# Patient Record
Sex: Female | Born: 1975 | Race: White | Hispanic: Yes | Marital: Married | Smoking: Never smoker
Health system: Southern US, Community
[De-identification: ages and names within clinical notes are randomized; demographics above are authoritative.]

## PROBLEM LIST (undated history)

## (undated) DIAGNOSIS — E119 Type 2 diabetes mellitus without complications: Secondary | ICD-10-CM

## (undated) HISTORY — PX: OTHER SURGICAL HISTORY: SHX169

## (undated) HISTORY — PX: TUBAL LIGATION: SHX77

---

## 2006-12-30 ENCOUNTER — Ambulatory Visit: Payer: Self-pay | Admitting: Internal Medicine

## 2007-01-01 ENCOUNTER — Ambulatory Visit: Payer: Self-pay | Admitting: *Deleted

## 2007-09-13 ENCOUNTER — Inpatient Hospital Stay (HOSPITAL_COMMUNITY): Admission: AC | Admit: 2007-09-13 | Discharge: 2007-09-18 | Payer: Self-pay

## 2007-09-15 ENCOUNTER — Ambulatory Visit: Payer: Self-pay | Admitting: Physical Medicine & Rehabilitation

## 2007-10-06 ENCOUNTER — Inpatient Hospital Stay (HOSPITAL_COMMUNITY): Admission: RE | Admit: 2007-10-06 | Discharge: 2007-10-08 | Payer: Self-pay | Admitting: Orthopaedic Surgery

## 2007-12-31 ENCOUNTER — Encounter: Admission: RE | Admit: 2007-12-31 | Discharge: 2008-03-23 | Payer: Self-pay | Admitting: Orthopaedic Surgery

## 2008-03-30 ENCOUNTER — Encounter: Admission: RE | Admit: 2008-03-30 | Discharge: 2008-04-30 | Payer: Self-pay | Admitting: Orthopaedic Surgery

## 2008-08-16 ENCOUNTER — Emergency Department (HOSPITAL_COMMUNITY): Admission: EM | Admit: 2008-08-16 | Discharge: 2008-08-16 | Payer: Self-pay | Admitting: Emergency Medicine

## 2008-10-13 ENCOUNTER — Ambulatory Visit: Payer: Self-pay | Admitting: Internal Medicine

## 2008-10-19 ENCOUNTER — Ambulatory Visit: Payer: Self-pay | Admitting: Internal Medicine

## 2008-12-16 ENCOUNTER — Encounter (INDEPENDENT_AMBULATORY_CARE_PROVIDER_SITE_OTHER): Payer: Self-pay | Admitting: Adult Health

## 2008-12-16 ENCOUNTER — Ambulatory Visit: Payer: Self-pay | Admitting: Internal Medicine

## 2008-12-16 LAB — CONVERTED CEMR LAB
Albumin: 5 g/dL (ref 3.5–5.2)
BUN: 11 mg/dL (ref 6–23)
CO2: 21 meq/L (ref 19–32)
Calcium: 9.3 mg/dL (ref 8.4–10.5)
Chloride: 106 meq/L (ref 96–112)
Eosinophils Relative: 14 % — ABNORMAL HIGH (ref 0–5)
Glucose, Bld: 93 mg/dL (ref 70–99)
HCT: 40.4 % (ref 36.0–46.0)
HDL: 54 mg/dL (ref >39)
Hemoglobin: 12.9 g/dL (ref 12.0–15.0)
Lymphocytes Relative: 23 % (ref 12–46)
Lymphs Abs: 1.8 10*3/uL (ref 0.7–4.0)
Monocytes Absolute: 0.5 10*3/uL (ref 0.1–1.0)
Monocytes Relative: 6 % (ref 3–12)
Potassium: 4.9 meq/L (ref 3.5–5.3)
RBC: 4.35 M/uL (ref 3.87–5.11)
RDW: 14.1 % (ref 11.5–15.5)
Sodium: 139 meq/L (ref 135–145)
Total Protein: 7.7 g/dL (ref 6.0–8.3)
Triglycerides: 88 mg/dL (ref <150)
WBC: 7.8 10*3/uL (ref 4.0–10.5)

## 2009-01-28 ENCOUNTER — Emergency Department (HOSPITAL_COMMUNITY): Admission: EM | Admit: 2009-01-28 | Discharge: 2009-01-28 | Payer: Self-pay | Admitting: Emergency Medicine

## 2009-02-11 ENCOUNTER — Ambulatory Visit: Payer: Self-pay | Admitting: Internal Medicine

## 2009-02-11 ENCOUNTER — Encounter: Payer: Self-pay | Admitting: Family Medicine

## 2009-02-11 LAB — CONVERTED CEMR LAB
Chlamydia, DNA Probe: NEGATIVE
GC Probe Amp, Genital: NEGATIVE

## 2009-08-02 ENCOUNTER — Ambulatory Visit: Payer: Self-pay | Admitting: Internal Medicine

## 2009-08-15 ENCOUNTER — Emergency Department (HOSPITAL_COMMUNITY): Admission: EM | Admit: 2009-08-15 | Discharge: 2009-08-15 | Payer: Self-pay | Admitting: Emergency Medicine

## 2009-09-08 ENCOUNTER — Ambulatory Visit: Payer: Self-pay | Admitting: Internal Medicine

## 2010-06-12 LAB — URINALYSIS, ROUTINE W REFLEX MICROSCOPIC
Bilirubin Urine: NEGATIVE
Ketones, ur: NEGATIVE mg/dL
Nitrite: NEGATIVE
Specific Gravity, Urine: 1.014 (ref 1.005–1.030)
Urobilinogen, UA: 1 mg/dL (ref 0.0–1.0)
pH: 6.5 (ref 5.0–8.0)

## 2010-07-04 LAB — URINALYSIS, ROUTINE W REFLEX MICROSCOPIC
Bilirubin Urine: NEGATIVE
Glucose, UA: NEGATIVE mg/dL
Ketones, ur: NEGATIVE mg/dL
Protein, ur: NEGATIVE mg/dL
pH: 7 (ref 5.0–8.0)
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
## 2010-12-21 LAB — CBC
HCT: 21.1 — ABNORMAL LOW
HCT: 34.2 — ABNORMAL LOW
Hemoglobin: 7.4 — CL
MCHC: 34.5
MCV: 89.5
MCV: 90.9
MCV: 93.3
MCV: 92.3
Platelets: 150
Platelets: 154
Platelets: 165
Platelets: 176
Platelets: 416 — ABNORMAL HIGH
RBC: 2.73 — ABNORMAL LOW
RBC: 3.78 — ABNORMAL LOW
RDW: 13.3
RDW: 14.7
RDW: 14.8
RDW: 15.2
WBC: 15 — ABNORMAL HIGH
WBC: 8.2
WBC: 9.3
WBC: 9.8
WBC: 9.2

## 2010-12-21 LAB — URINE CULTURE

## 2010-12-21 LAB — POCT I-STAT, CHEM 8
BUN: 9
Creatinine, Ser: 0.8
Glucose, Bld: 128 — ABNORMAL HIGH
Hemoglobin: 12.2
TCO2: 22

## 2010-12-21 LAB — HEMOGLOBIN AND HEMATOCRIT, BLOOD: Hemoglobin: 10.3 — ABNORMAL LOW

## 2010-12-21 LAB — BASIC METABOLIC PANEL
BUN: 1 — ABNORMAL LOW
BUN: 2 — ABNORMAL LOW
BUN: 6
CO2: 26
Calcium: 7.8 — ABNORMAL LOW
Calcium: 8.3 — ABNORMAL LOW
Chloride: 108
Creatinine, Ser: 0.42
Creatinine, Ser: 0.5
Creatinine, Ser: 0.55
GFR calc Af Amer: >60
GFR calc non Af Amer: >60
Glucose, Bld: 120 — ABNORMAL HIGH
Glucose, Bld: 124 — ABNORMAL HIGH
Sodium: 132 — ABNORMAL LOW

## 2010-12-21 LAB — COMPREHENSIVE METABOLIC PANEL
AST: 19
Albumin: 4.1
BUN: 7
Chloride: 102
Creatinine, Ser: 0.51
GFR calc Af Amer: >60
Potassium: 4.2
Total Protein: 7.3

## 2010-12-21 LAB — PROTIME-INR
INR: 1
INR: 1.1
INR: 1.2
INR: 1.4
INR: 1.1
INR: 1.5
Prothrombin Time: 14.4
Prothrombin Time: 15.3 — ABNORMAL HIGH
Prothrombin Time: 16.7 — ABNORMAL HIGH
Prothrombin Time: 17.1 — ABNORMAL HIGH

## 2010-12-21 LAB — URINALYSIS, ROUTINE W REFLEX MICROSCOPIC
Bilirubin Urine: NEGATIVE
Nitrite: NEGATIVE
Specific Gravity, Urine: 1.009
Urobilinogen, UA: 0.2

## 2010-12-21 LAB — TYPE AND SCREEN: Antibody Screen: NEGATIVE

## 2010-12-21 LAB — APTT: aPTT: 40 — ABNORMAL HIGH

## 2010-12-21 LAB — CROSSMATCH: ABO/RH(D): O POS

## 2010-12-21 LAB — ETHANOL: Alcohol, Ethyl (B): <5

## 2010-12-21 LAB — SAMPLE TO BLOOD BANK

## 2010-12-21 LAB — ABO/RH: ABO/RH(D): O POS

## 2010-12-21 LAB — URINE MICROSCOPIC-ADD ON

## 2011-05-05 ENCOUNTER — Emergency Department (HOSPITAL_COMMUNITY): Payer: Self-pay

## 2011-05-05 ENCOUNTER — Encounter (HOSPITAL_COMMUNITY): Payer: Self-pay | Admitting: *Deleted

## 2011-05-05 ENCOUNTER — Emergency Department (HOSPITAL_COMMUNITY)
Admission: EM | Admit: 2011-05-05 | Discharge: 2011-05-05 | Disposition: A | Discharge status: Home / Self Care | Payer: Self-pay | Attending: Emergency Medicine | Admitting: Emergency Medicine

## 2011-05-05 DIAGNOSIS — M79609 Pain in unspecified limb: Secondary | ICD-10-CM | POA: Insufficient documentation

## 2011-05-05 DIAGNOSIS — M545 Low back pain, unspecified: Secondary | ICD-10-CM | POA: Insufficient documentation

## 2011-05-05 DIAGNOSIS — IMO0001 Reserved for inherently not codable concepts without codable children: Secondary | ICD-10-CM | POA: Insufficient documentation

## 2011-05-05 DIAGNOSIS — M533 Sacrococcygeal disorders, not elsewhere classified: Secondary | ICD-10-CM | POA: Insufficient documentation

## 2011-05-05 MED ORDER — HYDROMORPHONE HCL PF 1 MG/ML IJ SOLN
1.0000 mg | Freq: Once | INTRAMUSCULAR | Status: AC
  Administered 2011-05-05: 1 mg via INTRAMUSCULAR
  Filled 2011-05-05: qty 1

## 2011-05-05 MED ORDER — DIAZEPAM 5 MG PO TABS
5.0000 mg | ORAL_TABLET | Freq: Once | ORAL | Status: AC
  Administered 2011-05-05: 5 mg via ORAL
  Filled 2011-05-05: qty 1

## 2011-05-05 MED ORDER — HYDROCODONE-ACETAMINOPHEN 5-325 MG PO TABS: ORAL_TABLET | ORAL | Status: AC

## 2011-05-05 MED ORDER — METHOCARBAMOL 500 MG PO TABS: 1000.0000 mg | ORAL_TABLET | Freq: Four times a day (QID) | ORAL | Status: AC | PRN
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
## 2011-05-05 NOTE — ED Notes (Signed)
Pt given interpreter phone to communicate with MD and RN

## 2011-05-05 NOTE — ED Notes (Signed)
[id number]<AV> Expected date:<BR> Expected time: 4:16 PM<BR> Means of arrival:<BR> Comments:<BR> M50 - 36yoF has pain from head to legs.  No injury
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
## 2011-05-05 NOTE — ED Notes (Signed)
Per EMS- pt is from home complaining of left sided back pain with radiation into her buttock and left leg.  Pt denies history of this prior. Pt ambulatory on scene. Pt complains of increased pain with movement. Pt has used Bengay and a pain medication of unknown name without relief today.

## 2013-03-23 ENCOUNTER — Emergency Department (INDEPENDENT_AMBULATORY_CARE_PROVIDER_SITE_OTHER): Payer: Self-pay

## 2013-03-23 ENCOUNTER — Encounter (HOSPITAL_COMMUNITY): Payer: Self-pay | Admitting: Emergency Medicine

## 2013-03-23 ENCOUNTER — Emergency Department (INDEPENDENT_AMBULATORY_CARE_PROVIDER_SITE_OTHER)
Admission: EM | Admit: 2013-03-23 | Discharge: 2013-03-23 | Disposition: A | Discharge status: Home / Self Care | Payer: Self-pay | Source: Home / Self Care | Attending: Emergency Medicine | Admitting: Emergency Medicine

## 2013-03-23 DIAGNOSIS — J209 Acute bronchitis, unspecified: Secondary | ICD-10-CM

## 2013-03-23 DIAGNOSIS — J111 Influenza due to unidentified influenza virus with other respiratory manifestations: Secondary | ICD-10-CM

## 2013-03-23 MED ORDER — AMOXICILLIN 500 MG PO CAPS: 1000.0000 mg | ORAL_CAPSULE | Freq: Three times a day (TID) | ORAL | Status: DC

## 2013-03-23 MED ORDER — TRAMADOL HCL 50 MG PO TABS: 100.0000 mg | ORAL_TABLET | Freq: Three times a day (TID) | ORAL | Status: DC | PRN

## 2013-03-23 MED ORDER — GUAIFENESIN-CODEINE 100-10 MG/5ML PO SYRP: 10.0000 mL | ORAL_SOLUTION | Freq: Four times a day (QID) | ORAL | Status: DC | PRN

## 2013-03-23 NOTE — ED Notes (Signed)
C/o productive cough that comes and goes.  Sob/chest tightness.  Bilateral ear pain.  Burning sensation in nose.  And  Sore throat.  No relief with otc meds.    On set 3 weeks ago.  Pt is sitting up right no signs of distress.

## 2013-03-23 NOTE — ED Provider Notes (Signed)
Chief Complaint   Chief Complaint  Patient presents with  . URI  . Dizziness  . Cough   History of Present Illness   Lynn Vazquez is a 37 year old Hispanic female who speaks English poorly, and therefore a facility interpreter was used to obtain a history. She presents with a three-week history of nonproductive cough, chest tightness, wheezing, chest pain, subjective fevers, not sleeping well at night, headache, abdominal pain, nasal congestion with clear drainage, sore throat, dizziness, and ear congestion. She also states that she's been depressed and crying. No suicidal ideation. She does not have a primary care physician.  Review of Systems   Other than as noted above, the patient denies any of the following symptoms: Systemic:  No fevers, chills, sweats, or myalgias. Eye:  No redness or discharge. ENT:  No ear pain, headache, nasal congestion, drainage, sinus pressure, or sore throat. Neck:  No neck pain, stiffness, or swollen glands. Lungs:  No cough, sputum production, hemoptysis, wheezing, chest tightness, shortness of breath or chest pain. GI:  No abdominal pain, nausea, vomiting or diarrhea.  PMFSH   Past medical history, family history, social history, meds, and allergies were reviewed.  Physical exam   Vital signs:  BP 117/77  Pulse 75  Temp(Src) 98.5 F (36.9 C) (Oral)  Resp 16  SpO2 100%  LMP 03/01/2013 General:  Alert and oriented.  In no distress.  Skin warm and dry. Eye:  No conjunctival injection or drainage. Lids were normal. ENT:  TMs and canals were normal, without erythema or inflammation.  Nasal mucosa was clear and uncongested, without drainage.  Mucous membranes were moist.  Pharynx was clear with no exudate or drainage.  There were no oral ulcerations or lesions. Neck:  Supple, no adenopathy, tenderness or mass. Lungs:  No respiratory distress.  Lungs were clear to auscultation, without wheezes, rales or rhonchi.  Breath sounds were  clear and equal bilaterally.  Heart:  Regular rhythm, without gallops, murmers or rubs. Skin:  Clear, warm, and dry, without rash or lesions.  Labs   Results for orders placed during the hospital encounter of 03/23/13  POCT RAPID STREP A ([facility] ONLY)      Result Value Range   Streptococcus, Group A Screen (Direct) NEGATIVE  NEGATIVE    Radiology   Dg Chest 2 View  03/23/2013   CLINICAL DATA:  Cough, fever.  EXAM: CHEST  2 VIEW  COMPARISON:  January 28, 2009  FINDINGS: The heart size and mediastinal contours are within normal limits. Both lungs are clear. No pleural effusion or pneumothorax is noted. The visualized skeletal structures are unremarkable.  IMPRESSION: No active cardiopulmonary disease.   Electronically Signed   By: Roque Lias M.D.   On: 03/23/2013 13:03   Assessment     The primary encounter diagnosis was Influenza. A diagnosis of Acute bronchitis was also pertinent to this visit.  Plan    1.  Meds:  The following meds were prescribed:   Discharge Medication List as of 03/23/2013  1:19 PM    START taking these medications   Details  amoxicillin (AMOXIL) 500 MG capsule Take 2 capsules (1,000 mg total) by mouth 3 (three) times daily., Starting 03/23/2013, Until Discontinued, Normal    guaiFENesin-codeine (GUIATUSS AC) 100-10 MG/5ML syrup Take 10 mLs by mouth 4 (four) times daily as needed for cough., Starting 03/23/2013, Until Discontinued, Print    traMADol (ULTRAM) 50 MG tablet Take 2 tablets (100 mg total) by mouth every 8 (  eight) hours as needed., Starting 03/23/2013, Until Discontinued, Normal        2.  Patient Education/Counseling:  The patient was given appropriate handouts, self care instructions, and instructed in symptomatic relief.  Instructed to get extra fluids, rest, and use a cool mist vaporizer.   3.  Follow up:  The patient was told to follow up here if no better in 3 to 4 days, or sooner if becoming worse in any way, and given some red flag  symptoms such as increasing fever, difficulty breathing, chest pain, or persistent vomiting which would prompt immediate return.  Follow up here as needed. Suggested she followup at the [facility] in the near future for her depression.      Reuben Likes, MD 03/23/13 [phone]

## 2013-03-25 LAB — CULTURE, GROUP A STREP

## 2013-04-02 ENCOUNTER — Ambulatory Visit: Payer: Self-pay | Attending: Internal Medicine | Admitting: Internal Medicine

## 2013-04-02 ENCOUNTER — Encounter: Payer: Self-pay | Admitting: Internal Medicine

## 2013-04-02 VITALS — BP 131/91 | HR 67 | Temp 98.7°F | Resp 14 | Ht 62.0 in | Wt 252.6 lb

## 2013-04-02 DIAGNOSIS — M25561 Pain in right knee: Secondary | ICD-10-CM

## 2013-04-02 DIAGNOSIS — J029 Acute pharyngitis, unspecified: Secondary | ICD-10-CM | POA: Insufficient documentation

## 2013-04-02 DIAGNOSIS — F329 Major depressive disorder, single episode, unspecified: Secondary | ICD-10-CM | POA: Insufficient documentation

## 2013-04-02 DIAGNOSIS — F3289 Other specified depressive episodes: Secondary | ICD-10-CM | POA: Insufficient documentation

## 2013-04-02 DIAGNOSIS — M542 Cervicalgia: Secondary | ICD-10-CM

## 2013-04-02 DIAGNOSIS — M25569 Pain in unspecified knee: Secondary | ICD-10-CM

## 2013-04-02 LAB — LIPID PANEL
CHOL/HDL RATIO: 3.2 ratio
Cholesterol: 149 mg/dL (ref 0–200)
HDL: 46 mg/dL (ref >39)
LDL CALC: 69 mg/dL (ref 0–99)
Triglycerides: 169 mg/dL — ABNORMAL HIGH (ref <150)
VLDL: 34 mg/dL (ref 0–40)

## 2013-04-02 LAB — COMPLETE METABOLIC PANEL WITH GFR
ALK PHOS: 81 U/L (ref 39–117)
ALT: 16 U/L (ref 0–35)
AST: 17 U/L (ref 0–37)
Albumin: 4.7 g/dL (ref 3.5–5.2)
BILIRUBIN TOTAL: 0.3 mg/dL (ref 0.3–1.2)
BUN: 9 mg/dL (ref 6–23)
CO2: 27 mEq/L (ref 19–32)
Calcium: 9.7 mg/dL (ref 8.4–10.5)
Chloride: 103 mEq/L (ref 96–112)
Creat: 0.57 mg/dL (ref 0.50–1.10)
GFR, Est African American: >89 mL/min
GLUCOSE: 89 mg/dL (ref 70–99)
Potassium: 4.4 mEq/L (ref 3.5–5.3)
SODIUM: 137 meq/L (ref 135–145)
TOTAL PROTEIN: 7.4 g/dL (ref 6.0–8.3)

## 2013-04-02 LAB — CBC WITH DIFFERENTIAL/PLATELET
Basophils Absolute: 0 10*3/uL (ref 0.0–0.1)
Basophils Relative: 0 % (ref 0–1)
EOS PCT: 8 % — AB (ref 0–5)
Eosinophils Absolute: 0.9 10*3/uL — ABNORMAL HIGH (ref 0.0–0.7)
HEMATOCRIT: 38.8 % (ref 36.0–46.0)
HEMOGLOBIN: 13.2 g/dL (ref 12.0–15.0)
LYMPHS ABS: 2.7 10*3/uL (ref 0.7–4.0)
LYMPHS PCT: 23 % (ref 12–46)
MCH: 29.6 pg (ref 26.0–34.0)
MCHC: 34 g/dL (ref 30.0–36.0)
MCV: 87 fL (ref 78.0–100.0)
MONO ABS: 0.7 10*3/uL (ref 0.1–1.0)
MONOS PCT: 6 % (ref 3–12)
Neutro Abs: 7.4 10*3/uL (ref 1.7–7.7)
Neutrophils Relative %: 63 % (ref 43–77)
Platelets: 306 10*3/uL (ref 150–400)
RBC: 4.46 MIL/uL (ref 3.87–5.11)
RDW: 13.8 % (ref 11.5–15.5)
WBC: 11.6 10*3/uL — AB (ref 4.0–10.5)

## 2013-04-02 LAB — POCT GLYCOSYLATED HEMOGLOBIN (HGB A1C): Hemoglobin A1C: 5.3

## 2013-04-02 MED ORDER — CYCLOBENZAPRINE HCL 5 MG PO TABS: 5.0000 mg | ORAL_TABLET | Freq: Three times a day (TID) | ORAL | Status: DC | PRN

## 2013-04-02 MED ORDER — BUPROPION HCL ER (SR) 150 MG PO TB12: 150.0000 mg | ORAL_TABLET | Freq: Two times a day (BID) | ORAL | Status: DC
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
## 2013-04-02 NOTE — Progress Notes (Signed)
Pt is here to establish care and a f/u from the urgent care. complains of possible inflammation in the throat with a cough. Taking cough medication, but does not help. Symptoms are getting worse. Also complains of dizziness, trouble swallowing, and eating only once a day. Pt feels depressed, little energy. Pt has an interpreter.

## 2013-04-04 ENCOUNTER — Telehealth: Payer: Self-pay | Admitting: *Deleted

## 2013-04-04 NOTE — Telephone Encounter (Signed)
Message copied by Khalil Szczepanik, [place] on Sat Apr 04, 2013  9:05 AM ------      Message from: Susie CassetteABROL MD, [facility] Center[facility]YANA      Created: Fri Apr 03, 2013  2:41 PM       Please notify patient of the labs are normal ------

## 2013-04-04 NOTE — Telephone Encounter (Signed)
Spoke with son because pt does not speak [place].

## 2013-04-10 ENCOUNTER — Ambulatory Visit: Payer: No Typology Code available for payment source | Attending: Internal Medicine

## 2013-07-01 ENCOUNTER — Ambulatory Visit: Payer: No Typology Code available for payment source | Attending: Internal Medicine | Admitting: Internal Medicine

## 2013-07-01 ENCOUNTER — Encounter: Payer: Self-pay | Admitting: Internal Medicine

## 2013-07-01 VITALS — BP 107/75 | HR 73 | Temp 98.6°F | Resp 16

## 2013-07-01 DIAGNOSIS — E669 Obesity, unspecified: Secondary | ICD-10-CM | POA: Insufficient documentation

## 2013-07-01 DIAGNOSIS — F329 Major depressive disorder, single episode, unspecified: Secondary | ICD-10-CM

## 2013-07-01 DIAGNOSIS — F3289 Other specified depressive episodes: Secondary | ICD-10-CM | POA: Insufficient documentation

## 2013-07-01 DIAGNOSIS — H6120 Impacted cerumen, unspecified ear: Secondary | ICD-10-CM

## 2013-07-01 DIAGNOSIS — Z09 Encounter for follow-up examination after completed treatment for conditions other than malignant neoplasm: Secondary | ICD-10-CM | POA: Insufficient documentation

## 2013-07-01 DIAGNOSIS — E781 Pure hyperglyceridemia: Secondary | ICD-10-CM | POA: Insufficient documentation

## 2013-07-01 DIAGNOSIS — F32A Depression, unspecified: Secondary | ICD-10-CM | POA: Insufficient documentation

## 2013-07-01 MED ORDER — BUPROPION HCL ER (SR) 150 MG PO TB12: 150.0000 mg | ORAL_TABLET | Freq: Two times a day (BID) | ORAL | Status: DC

## 2013-07-01 MED ORDER — CARBAMIDE PEROXIDE 6.5 % OT SOLN: 5.0000 [drp] | Freq: Two times a day (BID) | OTIC | Status: DC
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
## 2013-07-01 NOTE — Progress Notes (Signed)
Patient here for follow up on her depression

## 2013-07-12 ENCOUNTER — Emergency Department (HOSPITAL_COMMUNITY)
Admission: EM | Admit: 2013-07-12 | Discharge: 2013-07-12 | Disposition: A | Discharge status: Nursing Home (Includes ICF) | Payer: No Typology Code available for payment source | Source: Home / Self Care | Attending: Emergency Medicine | Admitting: Emergency Medicine

## 2013-07-12 ENCOUNTER — Encounter (HOSPITAL_COMMUNITY): Payer: Self-pay | Admitting: Emergency Medicine

## 2013-07-12 ENCOUNTER — Emergency Department (INDEPENDENT_AMBULATORY_CARE_PROVIDER_SITE_OTHER): Payer: No Typology Code available for payment source

## 2013-07-12 ENCOUNTER — Emergency Department (HOSPITAL_COMMUNITY)
Admission: EM | Admit: 2013-07-12 | Discharge: 2013-07-12 | Disposition: A | Discharge status: Home / Self Care | Payer: No Typology Code available for payment source | Attending: Emergency Medicine | Admitting: Emergency Medicine

## 2013-07-12 DIAGNOSIS — R42 Dizziness and giddiness: Secondary | ICD-10-CM | POA: Insufficient documentation

## 2013-07-12 DIAGNOSIS — R059 Cough, unspecified: Secondary | ICD-10-CM

## 2013-07-12 DIAGNOSIS — R509 Fever, unspecified: Secondary | ICD-10-CM

## 2013-07-12 DIAGNOSIS — R05 Cough: Secondary | ICD-10-CM

## 2013-07-12 DIAGNOSIS — R071 Chest pain on breathing: Secondary | ICD-10-CM

## 2013-07-12 DIAGNOSIS — Z79899 Other long term (current) drug therapy: Secondary | ICD-10-CM | POA: Insufficient documentation

## 2013-07-12 DIAGNOSIS — H538 Other visual disturbances: Secondary | ICD-10-CM | POA: Insufficient documentation

## 2013-07-12 DIAGNOSIS — M542 Cervicalgia: Secondary | ICD-10-CM

## 2013-07-12 DIAGNOSIS — B349 Viral infection, unspecified: Secondary | ICD-10-CM

## 2013-07-12 DIAGNOSIS — H53149 Visual discomfort, unspecified: Secondary | ICD-10-CM | POA: Insufficient documentation

## 2013-07-12 DIAGNOSIS — J029 Acute pharyngitis, unspecified: Secondary | ICD-10-CM | POA: Insufficient documentation

## 2013-07-12 DIAGNOSIS — B9789 Other viral agents as the cause of diseases classified elsewhere: Secondary | ICD-10-CM | POA: Insufficient documentation

## 2013-07-12 DIAGNOSIS — R110 Nausea: Secondary | ICD-10-CM | POA: Insufficient documentation

## 2013-07-12 DIAGNOSIS — Z792 Long term (current) use of antibiotics: Secondary | ICD-10-CM | POA: Insufficient documentation

## 2013-07-12 LAB — I-STAT CHEM 8, ED
BUN: 9 mg/dL (ref 6–23)
CALCIUM ION: 1.21 mmol/L (ref 1.12–1.23)
CHLORIDE: 101 meq/L (ref 96–112)
Creatinine, Ser: 0.7 mg/dL (ref 0.50–1.10)
GLUCOSE: 91 mg/dL (ref 70–99)
HCT: 42 % (ref 36.0–46.0)
Hemoglobin: 14.3 g/dL (ref 12.0–15.0)
Potassium: 4.2 mEq/L (ref 3.7–5.3)
Sodium: 139 mEq/L (ref 137–147)
TCO2: 26 mmol/L (ref 0–100)

## 2013-07-12 LAB — RAPID STREP SCREEN (MED CTR MEBANE ONLY): STREPTOCOCCUS, GROUP A SCREEN (DIRECT): NEGATIVE

## 2013-07-12 LAB — CBC WITH DIFFERENTIAL/PLATELET
BASOS PCT: 0 % (ref 0–1)
Basophils Absolute: 0 10*3/uL (ref 0.0–0.1)
Eosinophils Absolute: 0.9 10*3/uL — ABNORMAL HIGH (ref 0.0–0.7)
Eosinophils Relative: 7 % — ABNORMAL HIGH (ref 0–5)
HEMATOCRIT: 40 % (ref 36.0–46.0)
HEMOGLOBIN: 13.3 g/dL (ref 12.0–15.0)
LYMPHS PCT: 23 % (ref 12–46)
Lymphs Abs: 2.8 10*3/uL (ref 0.7–4.0)
MCH: 30.5 pg (ref 26.0–34.0)
MCHC: 33.3 g/dL (ref 30.0–36.0)
MCV: 91.7 fL (ref 78.0–100.0)
MONO ABS: 1.1 10*3/uL — AB (ref 0.1–1.0)
MONOS PCT: 9 % (ref 3–12)
NEUTROS ABS: 7.4 10*3/uL (ref 1.7–7.7)
Neutrophils Relative %: 61 % (ref 43–77)
Platelets: 266 10*3/uL (ref 150–400)
RBC: 4.36 MIL/uL (ref 3.87–5.11)
RDW: 13.9 % (ref 11.5–15.5)
WBC: 12.3 10*3/uL — ABNORMAL HIGH (ref 4.0–10.5)

## 2013-07-12 MED ORDER — IBUPROFEN 800 MG PO TABS
800.0000 mg | ORAL_TABLET | Freq: Once | ORAL | Status: AC
  Administered 2013-07-12: 800 mg via ORAL
  Filled 2013-07-12: qty 1
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
## 2013-07-12 NOTE — ED Notes (Signed)
Patient complains of sore throat Some dizziness with neck pain
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
## 2013-07-12 NOTE — ED Notes (Signed)
Pt sent from urgent care for further eval of posterior neck pain, dizziness, and fever x 3 days. Pt did not measure her temperature.

## 2013-07-13 NOTE — ED Provider Notes (Signed)
Medical screening examination/treatment/procedure(s) were performed by non-physician practitioner and as supervising physician I was immediately available for consultation/collaboration.  Leslee Homeavid Ramie Palladino, M.D.  Reuben Likesavid C Toshie Demelo, MD 07/13/13 1041

## 2013-07-14 LAB — CULTURE, GROUP A STREP

## 2013-07-15 LAB — CULTURE, GROUP A STREP

## 2014-01-25 ENCOUNTER — Encounter (HOSPITAL_COMMUNITY): Payer: Self-pay | Admitting: Emergency Medicine

## 2014-09-17 ENCOUNTER — Emergency Department (HOSPITAL_COMMUNITY): Payer: Self-pay

## 2014-09-17 ENCOUNTER — Emergency Department (HOSPITAL_COMMUNITY)
Admission: EM | Admit: 2014-09-17 | Discharge: 2014-09-17 | Disposition: A | Discharge status: Home / Self Care | Payer: Self-pay | Attending: Emergency Medicine | Admitting: Emergency Medicine

## 2014-09-17 ENCOUNTER — Encounter (HOSPITAL_COMMUNITY): Payer: Self-pay | Admitting: *Deleted

## 2014-09-17 DIAGNOSIS — Z3202 Encounter for pregnancy test, result negative: Secondary | ICD-10-CM | POA: Insufficient documentation

## 2014-09-17 DIAGNOSIS — R1013 Epigastric pain: Secondary | ICD-10-CM

## 2014-09-17 DIAGNOSIS — N390 Urinary tract infection, site not specified: Secondary | ICD-10-CM | POA: Insufficient documentation

## 2014-09-17 DIAGNOSIS — R112 Nausea with vomiting, unspecified: Secondary | ICD-10-CM | POA: Insufficient documentation

## 2014-09-17 DIAGNOSIS — Z79899 Other long term (current) drug therapy: Secondary | ICD-10-CM | POA: Insufficient documentation

## 2014-09-17 LAB — COMPREHENSIVE METABOLIC PANEL
ALT: 17 U/L (ref 14–54)
ANION GAP: 7 (ref 5–15)
AST: 21 U/L (ref 15–41)
Albumin: 4 g/dL (ref 3.5–5.0)
Alkaline Phosphatase: 89 U/L (ref 38–126)
BILIRUBIN TOTAL: 0.3 mg/dL (ref 0.3–1.2)
BUN: 7 mg/dL (ref 6–20)
CO2: 25 mmol/L (ref 22–32)
CREATININE: 0.76 mg/dL (ref 0.44–1.00)
Calcium: 9 mg/dL (ref 8.9–10.3)
Chloride: 106 mmol/L (ref 101–111)
GFR calc Af Amer: >60 mL/min (ref >60)
GFR calc non Af Amer: >60 mL/min (ref >60)
GLUCOSE: 112 mg/dL — AB (ref 65–99)
Potassium: 3.9 mmol/L (ref 3.5–5.1)
Sodium: 138 mmol/L (ref 135–145)
Total Protein: 7.4 g/dL (ref 6.5–8.1)

## 2014-09-17 LAB — CBC WITH DIFFERENTIAL/PLATELET
BASOS ABS: 0.1 10*3/uL (ref 0.0–0.1)
Basophils Relative: 1 % (ref 0–1)
EOS ABS: 1.1 10*3/uL — AB (ref 0.0–0.7)
Eosinophils Relative: 11 % — ABNORMAL HIGH (ref 0–5)
HEMATOCRIT: 39.8 % (ref 36.0–46.0)
HEMOGLOBIN: 13.2 g/dL (ref 12.0–15.0)
LYMPHS PCT: 22 % (ref 12–46)
Lymphs Abs: 2.1 10*3/uL (ref 0.7–4.0)
MCH: 29.7 pg (ref 26.0–34.0)
MCHC: 33.2 g/dL (ref 30.0–36.0)
MCV: 89.4 fL (ref 78.0–100.0)
Monocytes Absolute: 0.7 10*3/uL (ref 0.1–1.0)
Monocytes Relative: 7 % (ref 3–12)
NEUTROS PCT: 59 % (ref 43–77)
Neutro Abs: 5.6 10*3/uL (ref 1.7–7.7)
Platelets: 304 10*3/uL (ref 150–400)
RBC: 4.45 MIL/uL (ref 3.87–5.11)
RDW: 13.6 % (ref 11.5–15.5)
WBC: 9.5 10*3/uL (ref 4.0–10.5)

## 2014-09-17 LAB — I-STAT BETA HCG BLOOD, ED (MC, WL, AP ONLY)

## 2014-09-17 LAB — URINE MICROSCOPIC-ADD ON

## 2014-09-17 LAB — URINALYSIS, ROUTINE W REFLEX MICROSCOPIC
BILIRUBIN URINE: NEGATIVE
Glucose, UA: NEGATIVE mg/dL
Ketones, ur: NEGATIVE mg/dL
Nitrite: NEGATIVE
PROTEIN: 100 mg/dL — AB
Specific Gravity, Urine: 1.021 (ref 1.005–1.030)
Urobilinogen, UA: 1 mg/dL (ref 0.0–1.0)
pH: 7.5 (ref 5.0–8.0)

## 2014-09-17 LAB — LIPASE, BLOOD: LIPASE: 28 U/L (ref 22–51)

## 2014-09-17 LAB — POC URINE PREG, ED: Preg Test, Ur: NEGATIVE

## 2014-09-17 MED ORDER — IOHEXOL 300 MG/ML  SOLN
100.0000 mL | Freq: Once | INTRAMUSCULAR | Status: AC | PRN
  Administered 2014-09-17: 100 mL via INTRAVENOUS

## 2014-09-17 MED ORDER — IOHEXOL 300 MG/ML  SOLN
25.0000 mL | Freq: Once | INTRAMUSCULAR | Status: AC | PRN
  Administered 2014-09-17: 25 mL via ORAL

## 2014-09-17 MED ORDER — DEXTROSE 5 % IV SOLN: 1.0000 g | Freq: Once | INTRAVENOUS | Status: DC

## 2014-09-17 MED ORDER — PANTOPRAZOLE SODIUM 40 MG IV SOLR
40.0000 mg | Freq: Once | INTRAVENOUS | Status: AC
  Administered 2014-09-17: 40 mg via INTRAVENOUS
  Filled 2014-09-17: qty 40

## 2014-09-17 MED ORDER — CEFTRIAXONE SODIUM 1 G IJ SOLR
1.0000 g | Freq: Once | INTRAMUSCULAR | Status: AC
  Administered 2014-09-17: 1 g via INTRAMUSCULAR
  Filled 2014-09-17: qty 10

## 2014-09-17 MED ORDER — ONDANSETRON 4 MG PO TBDP
ORAL_TABLET | ORAL | Status: AC
  Filled 2014-09-17: qty 8

## 2014-09-17 MED ORDER — SODIUM CHLORIDE 0.9 % IV BOLUS (SEPSIS)
1000.0000 mL | Freq: Once | INTRAVENOUS | Status: AC
  Administered 2014-09-17: 1000 mL via INTRAVENOUS

## 2014-09-17 MED ORDER — MORPHINE SULFATE 4 MG/ML IJ SOLN
4.0000 mg | Freq: Once | INTRAMUSCULAR | Status: AC
  Administered 2014-09-17: 4 mg via INTRAVENOUS
  Filled 2014-09-17: qty 1

## 2014-09-17 MED ORDER — GI COCKTAIL ~~LOC~~
30.0000 mL | Freq: Once | ORAL | Status: AC
  Administered 2014-09-17: 30 mL via ORAL
  Filled 2014-09-17: qty 30

## 2014-09-17 MED ORDER — ONDANSETRON 4 MG PO TBDP
8.0000 mg | ORAL_TABLET | Freq: Once | ORAL | Status: AC
  Administered 2014-09-17: 8 mg via ORAL

## 2014-09-17 MED ORDER — LIDOCAINE HCL (PF) 1 % IJ SOLN
2.0000 mL | Freq: Once | INTRAMUSCULAR | Status: AC
  Administered 2014-09-17: 2 mL via INTRADERMAL
  Filled 2014-09-17: qty 5

## 2014-09-17 MED ORDER — FAMOTIDINE 20 MG PO TABS: 20.0000 mg | ORAL_TABLET | Freq: Two times a day (BID) | ORAL | Status: DC

## 2014-09-17 MED ORDER — CEPHALEXIN 500 MG PO CAPS: 500.0000 mg | ORAL_CAPSULE | Freq: Two times a day (BID) | ORAL | Status: DC

## 2014-09-17 NOTE — Discharge Instructions (Signed)
Read the information below.  Use the prescribed medication as directed.  Please discuss all new medications with your pharmacist.  You may return to the Emergency Department at any time for worsening condition or any new symptoms that concern you.   If you develop high fevers, worsening abdominal pain, uncontrolled vomiting, or are unable to tolerate fluids by mouth, return to the ER for a recheck.    Lea la siguiente informacin . Usar el medicamento recetado como se indica. Por favor discutir todos los medicamentos nuevos con su farmacutico . Usted puede volver a la sala de urgencias en cualquier momento por cualquier condicin o nuevos sntomas que le preocupan empeoramiento . Si desarrolla fiebre alta, empeoramiento del dolor abdominal, vmitos incontrolados , o es incapaz de Web designer lquidos por va oral , volver a la sala de emergencias de volver a examinar .   Dolor abdominal (Abdominal Pain) El dolor puede tener muchas causas. Normalmente la causa del dolor abdominal no es una enfermedad y Scientist, clinical (histocompatibility and immunogenetics) sin TEFL teacher. Frecuentemente puede controlarse y tratarse en casa. Su mdico le Medical sales representative examen fsico y posiblemente solicite anlisis de sangre y radiografas para ayudar a Chief Strategy Officer la gravedad de su dolor. Sin embargo, en [employer], debe transcurrir ms tiempo antes de que se pueda Clinical research associate una causa evidente del dolor. Antes de llegar a ese punto, es posible que su mdico no sepa si necesita ms pruebas o un tratamiento ms profundo. INSTRUCCIONES PARA EL CUIDADO EN EL HOGAR  Est atento al dolor para ver si hay cambios. Las siguientes indicaciones ayudarn a Architectural technologist que pueda sentir:  [place] solo medicamentos de venta libre o recetados, segn las indicaciones del mdico.  No tome laxantes a menos que se lo haya indicado su mdico.  Pruebe con [place]om[place] Dear dieta lquida absoluta (caldo, t o agua) segn se lo indique su mdico. Introduzca gradualmente una dieta normal,  segn su tolerancia. SOLICITE ATENCIN MDICA SI:  Tiene dolor abdominal sin explicacin.  Tiene dolor abdominal relacionado con nuseas o diarrea.  Tiene dolor cuando orina o defeca.  Experimenta dolor abdominal que lo despierta de noche.  Tiene dolor abdominal que empeora o mejora cuando come alimentos.  Tiene dolor abdominal que empeora cuando come alimentos grasosos.  Tiene fiebre. SOLICITE ATENCIN MDICA DE INMEDIATO SI:   El dolor no desaparece en un plazo mximo de 2horas.  No deja de (vomitar).  El Engineer, mining se siente solo en partes del abdomen, como el lado derecho o la parte inferior izquierda del abdomen.  Evaca materia fecal sanguinolenta o negra, de aspecto alquitranado. ASEGRESE DE QUE:  Comprende estas instrucciones.  Controlar su afeccin.  Recibir ayuda de inmediato si no mejora o si empeora. Document Released: 03/12/2005 Document Revised: 03/17/2013 [facility] Patient Information 2015 [place], [place]. This information is not intended to replace advice given to you by your health care provider. Make sure you discuss any questions you have with your health care provider.  Infeccin urinaria  (Urinary Tract Infection)  La infeccin urinaria puede ocurrir en Corporate treasurer del tracto urinario. El tracto urinario es un sistema de drenaje del cuerpo por el que se eliminan los desechos y el exceso de [place]. El tracto urinario est formado por dos riones, dos urteres, la vejiga y Engineer, mining. Los riones son rganos que tienen forma de frijol. Cada rin tiene aproximadamente el tamao del puo. Estn situados debajo de las [place], uno a cada lado de la columna vertebral CAUSAS  La causa de la infeccin son los  microbios, que son organismos microscpicos, que incluyen hongos, virus, y bacterias. Estos organismos son tan pequeos que slo pueden verse a travs del microscopio. Las bacterias son los microorganismos que ms comnmente causan infecciones urinarias.    SNTOMAS  Los sntomas pueden variar segn la edad y el sexo del paciente y por la ubicacin de la infeccin. Los sntomas en las mujeres jvenes incluyen la necesidad frecuente e intensa de orinar y una sensacin dolorosa de ardor en la vejiga o en la uretra durante la miccin. Las mujeres y los hombres mayores podrn sentir cansancio, temblores y debilidad y Futures trader musculares y Engineer, mining abdominal. Si tiene [place], puede significar que la infeccin est en los riones. Otros sntomas son dolor en la espalda o en los lados debajo de las [place], nuseas y vmitos.  DIAGNSTICO  Para diagnosticar una infeccin urinaria, el mdico le preguntar acerca de sus sntomas. [employer] una [place] de [place]. La muestra de orina se analiza para Engineer, manufacturing bacterias y glbulos blancos de Risk manager. Los glbulos blancos se forman en el organismo para ayudar a Artist las infecciones.  TRATAMIENTO  Por lo general, las infecciones urinarias pueden tratarse con medicamentos. Debido a que la [employer] de las infecciones son causadas por bacterias, por lo general pueden tratarse con antibiticos. La eleccin del antibitico y la duracin del tratamiento depender de sus sntomas y el tipo de bacteria causante de la infeccin.  INSTRUCCIONES PARA EL CUIDADO EN EL HOGAR   Si le recetaron antibiticos, tmelos exactamente como su mdico le indique. Termine el medicamento aunque se sienta mejor despus de haber tomado slo algunos.  Beba gran cantidad de lquido para mantener la orina de tono claro o color amarillo plido.  Evite la cafena, el t y las [address]. Estas sustancias irritan la vejiga.  Vaciar la vejiga con frecuencia. Evite retener la orina durante largos perodos.  Vace la vejiga antes y despus de Management consultant.  Despus de mover el intestino, las mujeres deben higienizarse la regin perineal desde adelante hacia atrs. Use slo un papel tissue por vez. SOLICITE  ATENCIN MDICA SI:   Siente dolor en la espalda.  Le sube la fiebre.  Los sntomas no mejoran luego de [address]. SOLICITE ATENCIN MDICA DE INMEDIATO SI:   Siente dolor intenso en la espalda o en la zona inferior del abdomen.  Comienza a sentir escalofros.  Tiene nuseas o vmitos.  Tiene una sensacin continua de quemazn o molestias al [employer]. ASEGRESE DE QUE:   Comprende estas instrucciones.  Controlar su enfermedad.  Solicitar ayuda de inmediato si no mejora o empeora. Document Released: 12/20/2004 Document Revised: 12/05/2011 [facility] Patient Information 2015 [place], [place]. This information is not intended to replace advice given to you by your health care provider. Make sure you discuss any questions you have with your health care provider.

## 2014-09-17 NOTE — ED Notes (Signed)
Patient Returned from CT.

## 2014-09-17 NOTE — ED Notes (Signed)
pts daughter approached nurse first stating her mom was having abdominal pain called  Minerva Areola, RN and made him aware.

## 2014-09-17 NOTE — ED Notes (Signed)
Pt aware of urine sample needed, pt unable to go at this time. Pt states "she hasn't had anything to drink".

## 2014-09-17 NOTE — ED Notes (Signed)
t c/o abd pain x 3days. Pt states that she feels like her stomach is distended and hard. Pt has vomited x 2 today, denies diarrhea. Also feels like she is going to faint.

## 2014-09-17 NOTE — ED Notes (Signed)
Pt declined oral pain medication at this time. States that she zofran made her feel more nauseous. Explained wait time.

## 2014-09-17 NOTE — ED Notes (Signed)
Pt dropped urine sample in toilet, beta hcg added.

## 2014-09-17 NOTE — ED Notes (Signed)
PA at the bedside.
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
## 2015-05-12 ENCOUNTER — Encounter (HOSPITAL_COMMUNITY): Payer: Self-pay | Admitting: Cardiology

## 2015-05-12 ENCOUNTER — Emergency Department (HOSPITAL_COMMUNITY)
Admission: EM | Admit: 2015-05-12 | Discharge: 2015-05-12 | Disposition: A | Discharge status: Home / Self Care | Payer: Self-pay | Attending: Emergency Medicine | Admitting: Emergency Medicine

## 2015-05-12 DIAGNOSIS — R519 Headache, unspecified: Secondary | ICD-10-CM

## 2015-05-12 DIAGNOSIS — H538 Other visual disturbances: Secondary | ICD-10-CM | POA: Insufficient documentation

## 2015-05-12 DIAGNOSIS — R51 Headache: Secondary | ICD-10-CM | POA: Insufficient documentation

## 2015-05-12 DIAGNOSIS — N390 Urinary tract infection, site not specified: Secondary | ICD-10-CM

## 2015-05-12 LAB — CBC
HEMATOCRIT: 40.9 % (ref 36.0–46.0)
Hemoglobin: 13.2 g/dL (ref 12.0–15.0)
MCH: 29.2 pg (ref 26.0–34.0)
MCHC: 32.3 g/dL (ref 30.0–36.0)
MCV: 90.5 fL (ref 78.0–100.0)
Platelets: 287 10*3/uL (ref 150–400)
RBC: 4.52 MIL/uL (ref 3.87–5.11)
RDW: 13.4 % (ref 11.5–15.5)
WBC: 10.2 10*3/uL (ref 4.0–10.5)

## 2015-05-12 LAB — URINALYSIS, ROUTINE W REFLEX MICROSCOPIC
Bilirubin Urine: NEGATIVE
GLUCOSE, UA: NEGATIVE mg/dL
KETONES UR: NEGATIVE mg/dL
Nitrite: POSITIVE — AB
PH: 6.5 (ref 5.0–8.0)
Protein, ur: NEGATIVE mg/dL
Specific Gravity, Urine: 1.007 (ref 1.005–1.030)

## 2015-05-12 LAB — COMPREHENSIVE METABOLIC PANEL
ALBUMIN: 4.1 g/dL (ref 3.5–5.0)
ALK PHOS: 93 U/L (ref 38–126)
ALT: 20 U/L (ref 14–54)
AST: 21 U/L (ref 15–41)
Anion gap: 10 (ref 5–15)
BUN: 8 mg/dL (ref 6–20)
CHLORIDE: 105 mmol/L (ref 101–111)
CO2: 23 mmol/L (ref 22–32)
Calcium: 9.4 mg/dL (ref 8.9–10.3)
Creatinine, Ser: 0.48 mg/dL (ref 0.44–1.00)
GFR calc Af Amer: >60 mL/min (ref >60)
Glucose, Bld: 117 mg/dL — ABNORMAL HIGH (ref 65–99)
Potassium: 4.5 mmol/L (ref 3.5–5.1)
Sodium: 138 mmol/L (ref 135–145)
Total Bilirubin: 0.4 mg/dL (ref 0.3–1.2)
Total Protein: 7.6 g/dL (ref 6.5–8.1)

## 2015-05-12 LAB — LIPASE, BLOOD: Lipase: 32 U/L (ref 11–51)

## 2015-05-12 LAB — URINE MICROSCOPIC-ADD ON

## 2015-05-12 LAB — I-STAT BETA HCG BLOOD, ED (MC, WL, AP ONLY): I-stat hCG, quantitative: <5 m[IU]/mL (ref <5)

## 2015-05-12 MED ORDER — KETOROLAC TROMETHAMINE 30 MG/ML IJ SOLN
30.0000 mg | Freq: Once | INTRAMUSCULAR | Status: AC
  Administered 2015-05-12: 30 mg via INTRAVENOUS
  Filled 2015-05-12: qty 1

## 2015-05-12 MED ORDER — METOCLOPRAMIDE HCL 5 MG/ML IJ SOLN
10.0000 mg | Freq: Once | INTRAMUSCULAR | Status: AC
  Administered 2015-05-12: 10 mg via INTRAVENOUS
  Filled 2015-05-12: qty 2

## 2015-05-12 MED ORDER — PHENAZOPYRIDINE HCL 200 MG PO TABS: 200.0000 mg | ORAL_TABLET | Freq: Three times a day (TID) | ORAL | Status: DC

## 2015-05-12 MED ORDER — SODIUM CHLORIDE 0.9 % IV BOLUS (SEPSIS)
500.0000 mL | Freq: Once | INTRAVENOUS | Status: AC
  Administered 2015-05-12: 500 mL via INTRAVENOUS

## 2015-05-12 MED ORDER — DEXTROSE 5 % IV SOLN: 1.0000 g | Freq: Once | INTRAVENOUS | Status: DC

## 2015-05-12 MED ORDER — DIPHENHYDRAMINE HCL 50 MG/ML IJ SOLN
12.5000 mg | Freq: Once | INTRAMUSCULAR | Status: AC
  Administered 2015-05-12: 12.5 mg via INTRAVENOUS
  Filled 2015-05-12: qty 1

## 2015-05-12 MED ORDER — NITROFURANTOIN MONOHYD MACRO 100 MG PO CAPS
100.0000 mg | ORAL_CAPSULE | Freq: Once | ORAL | Status: AC
  Administered 2015-05-12: 100 mg via ORAL
  Filled 2015-05-12: qty 1

## 2015-05-12 MED ORDER — DEXAMETHASONE SODIUM PHOSPHATE 10 MG/ML IJ SOLN
10.0000 mg | Freq: Once | INTRAMUSCULAR | Status: AC
  Administered 2015-05-12: 10 mg via INTRAVENOUS
  Filled 2015-05-12: qty 1

## 2015-05-12 MED ORDER — NITROFURANTOIN MONOHYD MACRO 100 MG PO CAPS: 100.0000 mg | ORAL_CAPSULE | Freq: Two times a day (BID) | ORAL | Status: DC

## 2015-05-12 NOTE — Discharge Instructions (Signed)
Dolor de cabeza general sin causa (General Headache Without Cause) El dolor de cabeza es un dolor o malestar que se siente en la zona de la cabeza o del cuello. Puede no tener una causa especfica. Hay muchas causas y tipos de dolores de [place]. Los dolores de cabeza ms comunes son los siguientes:  Cefalea tensional.  Cefaleas migraosas.  Cefalea en brotes.  Cefaleas diarias crnicas. INSTRUCCIONES PARA EL CUIDADO EN EL HOGAR  Controle su afeccin para ver si hay cambios. Siga estos pasos para Scientist, physiological afeccin: Control del [employer] medicamentos de venta libre y los recetados solamente como se lo haya indicado el mdico.  Cuando sienta dolor de cabeza acustese en un cuarto oscuro y tranquilo.  Si se lo indican, aplique hielo sobre la cabeza y la zona del cuello:  Ponga el hielo en una bolsa plstica.  Coloque una toalla entre la piel y la bolsa de hielo.  Coloque el hielo durante , 2 a 3veces por Futures trader.  Utilice una almohadilla trmica o tome una ducha con agua caliente para aplicar calor en la cabeza y la zona del cuello como se lo haya indicado el mdico.  Mantenga las luces tenues si le [employer] luces brillantes o sus dolores de cabeza empeoran. Comida y bebida  Mantenga un horario para las comidas.  Limite el consumo de bebidas alcohlicas.  Consuma menos cantidad de cafena o deje de tomarla. Instrucciones generales  Concurra a todas las visitas de control como se lo haya indicado el mdico. Esto es importante.  Lleve un diario de los dolores de cabeza para Financial risk analyst qu factores pueden desencadenarlos. Por ejemplo, escriba los siguientes datos:  Lo que usted come y Estate agent.  Cunto tiempo duerme.  Algn cambio en su dieta o en los medicamentos.  Pruebe algunas tcnicas de relajacin, como los [place].  Limite el estrs.  Sintese con la espalda recta y no tense los msculos.  No consuma productos que contengan tabaco, incluidos  cigarrillos, tabaco de Theatre manager o cigarrillos electrnicos. Si necesita ayuda para dejar de fumar, consulte al mdico.  Haga actividad fsica habitualmente como se lo haya indicado el mdico.  Tenga un horario fijo para dormir. Duerma entre 7 y 9horas o la cantidad de horas que le haya recomendado el mdico. SOLICITE ATENCIN MDICA SI:   Los medicamentos no Materials engineer los sntomas.  Tiene un dolor de cabeza que es diferente del dolor de cabeza habitual.  Tiene nuseas o vmitos.  Tiene fiebre. SOLICITE ATENCIN MDICA DE INMEDIATO SI:   El dolor se hace cada vez ms intenso.  Ha vomitado repetidas veces.  Presenta rigidez en el cuello.  Sufre prdida de la visin.  Tiene problemas para hablar.  Siente dolor en el ojo o en el odo.  Presenta debilidad muscular o prdida del control muscular.  Pierde el equilibrio o tiene problemas para Advertising account planner.  Sufre mareos o se desmaya.  Se siente confundido.   Esta informacin no tiene Theme park manager el consejo del mdico. Asegrese de hacerle al mdico cualquier pregunta que tenga.   Document Released: 12/20/2004 Document Revised: 12/01/2014 Elsevier Interactive Patient Education 2016 Elsevier Inc.  Pervis Hocking [place]  (Urinary Tract Infection)  Neomia Dear infeccin urinaria puede ocurrir en cualquier lugar del tracto urinario. El tracto [employer] riones, urteres, la vejiga y Engineer, mining. La causa es un germen llamado bacteria. La infeccin urinaria mejora con antibiticos.  CUIDADOS EN EL HOGAR   Si le recetaron antibiticos, tmelos como le haya indicado  el mdico. Tmelos todos, aunque se sienta mejor.  Beba gran cantidad de lquido para mantener el pis (orina) de tono claro o amarillo plido.  Evite el t, las bebidas con cafena y las bebidas gaseosas (carbonatada).  Orine con frecuencia. Evite retener la [employer].  Orine antes y despus de tener sexo (relaciones sexuales).  Si es [place],  higiencese desde adelante hacia atrs despus de ir de cuerpo (mover el intestino). Use slo un papel tissue por vez. SOLICITE AYUDA DE INMEDIATO SI:   Siente dolor en la espalda.  Siente un dolor en el vientre (abdominal) muy intenso.  Tiene escalofros.  Tiene Programme researcher, broadcasting/film/video (nuseas).  Vomita.  El ardor o las molestias al orinar no desaparecen.  Tiene fiebre.  Los sntomas no mejoran despus de [address]. ASEGRESE DE QUE:   Comprende estas instrucciones.  Controlar su enfermedad.  Solicitar ayuda de inmediato si no mejora o si empeora.   Esta informacin no tiene Theme park manager el consejo del mdico. Asegrese de hacerle al mdico cualquier pregunta que tenga.   Follow up with your primary care provider for re-evaluation. Take antibiotics as prescribed. Encourage adequate hydration, drink plenty of fluids. May take ibuprofen as needed for headache. Return to the ED if you experience severe worsening of your symptoms, fever, chills, back pain, blood in urine, loss of consciousness.

## 2015-05-12 NOTE — ED Notes (Signed)
Reports dysuria, abd pain and headache for the past 4 days. Has tried OTC medication without much relief.
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
## 2015-05-12 NOTE — ED Notes (Signed)
PA at bedside.

## 2015-05-13 LAB — URINE CULTURE: Special Requests: NORMAL

## 2016-08-13 ENCOUNTER — Encounter (HOSPITAL_COMMUNITY): Payer: Self-pay | Admitting: Emergency Medicine

## 2016-08-13 ENCOUNTER — Emergency Department (HOSPITAL_COMMUNITY): Payer: Self-pay

## 2016-08-13 ENCOUNTER — Emergency Department (HOSPITAL_COMMUNITY)
Admission: EM | Admit: 2016-08-13 | Discharge: 2016-08-13 | Disposition: A | Discharge status: Home / Self Care | Payer: Self-pay | Attending: Emergency Medicine | Admitting: Emergency Medicine

## 2016-08-13 DIAGNOSIS — Z7984 Long term (current) use of oral hypoglycemic drugs: Secondary | ICD-10-CM | POA: Insufficient documentation

## 2016-08-13 DIAGNOSIS — E119 Type 2 diabetes mellitus without complications: Secondary | ICD-10-CM | POA: Insufficient documentation

## 2016-08-13 DIAGNOSIS — G932 Benign intracranial hypertension: Secondary | ICD-10-CM | POA: Insufficient documentation

## 2016-08-13 DIAGNOSIS — R51 Headache: Secondary | ICD-10-CM

## 2016-08-13 DIAGNOSIS — R519 Headache, unspecified: Secondary | ICD-10-CM

## 2016-08-13 HISTORY — DX: Type 2 diabetes mellitus without complications: E11.9

## 2016-08-13 LAB — CBC WITH DIFFERENTIAL/PLATELET
BASOS ABS: 0 10*3/uL (ref 0.0–0.1)
BASOS PCT: 0 %
EOS PCT: 6 %
Eosinophils Absolute: 0.6 10*3/uL (ref 0.0–0.7)
HEMATOCRIT: 40.1 % (ref 36.0–46.0)
Hemoglobin: 13.4 g/dL (ref 12.0–15.0)
LYMPHS ABS: 1.6 10*3/uL (ref 0.7–4.0)
Lymphocytes Relative: 15 %
MCH: 29.8 pg (ref 26.0–34.0)
MCHC: 33.4 g/dL (ref 30.0–36.0)
MCV: 89.3 fL (ref 78.0–100.0)
MONOS PCT: 6 %
Monocytes Absolute: 0.6 10*3/uL (ref 0.1–1.0)
Neutro Abs: 7.7 10*3/uL (ref 1.7–7.7)
Neutrophils Relative %: 73 %
Platelets: 272 10*3/uL (ref 150–400)
RBC: 4.49 MIL/uL (ref 3.87–5.11)
RDW: 13.5 % (ref 11.5–15.5)
WBC: 10.6 10*3/uL — ABNORMAL HIGH (ref 4.0–10.5)

## 2016-08-13 LAB — BASIC METABOLIC PANEL
Anion gap: 8 (ref 5–15)
CHLORIDE: 103 mmol/L (ref 101–111)
CO2: 23 mmol/L (ref 22–32)
Calcium: 9 mg/dL (ref 8.9–10.3)
Creatinine, Ser: 0.39 mg/dL — ABNORMAL LOW (ref 0.44–1.00)
GFR calc Af Amer: >60 mL/min (ref >60)
GFR calc non Af Amer: >60 mL/min (ref >60)
Glucose, Bld: 154 mg/dL — ABNORMAL HIGH (ref 65–99)
Potassium: 4 mmol/L (ref 3.5–5.1)
Sodium: 134 mmol/L — ABNORMAL LOW (ref 135–145)

## 2016-08-13 LAB — CSF CELL COUNT WITH DIFFERENTIAL
Other Cells, CSF: NONE SEEN
RBC COUNT CSF: 0 /mm3
RBC Count, CSF: 1 /mm3 — ABNORMAL HIGH
TUBE #: 4
Tube #: 1
WBC CSF: 1 /mm3 (ref 0–5)
WBC CSF: 1 /mm3 (ref 0–5)

## 2016-08-13 LAB — GLUCOSE, CSF: GLUCOSE CSF: 86 mg/dL — AB (ref 40–70)

## 2016-08-13 LAB — PROTEIN, CSF: Total  Protein, CSF: 14 mg/dL — ABNORMAL LOW (ref 15–45)

## 2016-08-13 MED ORDER — ACETAMINOPHEN 325 MG PO TABS
650.0000 mg | ORAL_TABLET | Freq: Once | ORAL | Status: AC
Start: 2016-08-13 — End: 2016-08-13
  Administered 2016-08-13: 650 mg via ORAL
  Filled 2016-08-13: qty 2

## 2016-08-13 MED ORDER — GADOBENATE DIMEGLUMINE 529 MG/ML IV SOLN
20.0000 mL | Freq: Once | INTRAVENOUS | Status: AC | PRN
  Administered 2016-08-13: 20 mL via INTRAVENOUS

## 2016-08-13 MED ORDER — LIDOCAINE HCL (PF) 1 % IJ SOLN
INTRAMUSCULAR | Status: AC
  Administered 2016-08-13: 5 mL
  Filled 2016-08-13: qty 5

## 2016-08-13 MED ORDER — PROCHLORPERAZINE EDISYLATE 5 MG/ML IJ SOLN
10.0000 mg | Freq: Once | INTRAMUSCULAR | Status: AC
  Administered 2016-08-13: 10 mg via INTRAVENOUS
  Filled 2016-08-13: qty 2

## 2016-08-13 MED ORDER — SODIUM CHLORIDE 0.9 % IV BOLUS (SEPSIS)
1000.0000 mL | Freq: Once | INTRAVENOUS | Status: AC
  Administered 2016-08-13: 1000 mL via INTRAVENOUS

## 2016-08-13 MED ORDER — ACETAZOLAMIDE 125 MG PO TABS: 500.0000 mg | ORAL_TABLET | Freq: Two times a day (BID) | ORAL | 0 refills | Status: DC

## 2016-08-13 MED ORDER — DIPHENHYDRAMINE HCL 50 MG/ML IJ SOLN
25.0000 mg | Freq: Once | INTRAMUSCULAR | Status: AC
  Administered 2016-08-13: 25 mg via INTRAVENOUS
  Filled 2016-08-13: qty 1

## 2016-08-13 MED ORDER — DEXAMETHASONE SODIUM PHOSPHATE 10 MG/ML IJ SOLN
10.0000 mg | Freq: Once | INTRAMUSCULAR | Status: AC
  Administered 2016-08-13: 10 mg via INTRAVENOUS
  Filled 2016-08-13: qty 1

## 2016-08-13 NOTE — Discharge Instructions (Signed)
Take Acetazolamide twice daily as directed. Schedule appointment as soon as possible with ophthalmologist listed below. Return to ED for worsening pain, head injury, worsening vision changes, uncontrollable vomiting, trouble walking, numbness or weakness.   Tome Acetazolamide dos veces al da segn las indicaciones. Programe una cita tan pronto como sea posible con el oftalmlogo que se detalla a continuacin. Regrese a la sala de emergencias por empeoramiento del dolor, lesin en la cabeza, empeoramiento de los cambios de la vista, vmitos incontrolables, dificultad para caminar, entumecimiento o debilidad.

## 2016-08-13 NOTE — ED Notes (Signed)
LP procedure complete pt tolerated well

## 2016-08-13 NOTE — ED Provider Notes (Signed)
.  Lumbar Puncture Date/Time: 08/13/2016 3:27 PM Performed by: Shaune PollackISAACS, Clifton Safley Authorized by: Shaune PollackISAACS, Marabeth Melland   Consent:    Consent obtained:  Written   Consent given by:  Patient   Risks discussed:  Bleeding, headache, infection, nerve damage, repeat procedure and pain   Alternatives discussed:  Delayed treatment, alternative treatment and observation Pre-procedure details:    Procedure purpose:  Diagnostic   Preparation: Patient was prepped and draped in usual sterile fashion   Anesthesia (see MAR for exact dosages):    Anesthesia method:  Local infiltration   Local anesthetic:  Lidocaine 1% w/o epi Procedure details:    Lumbar space:  L4-L5 interspace   Patient position:  L lateral decubitus   Needle gauge:  20   Needle type:  Spinal needle - Quincke tip   Needle length (in):  3.5   Ultrasound guidance: no     Number of attempts:  1   Opening pressure (cm H2O):  25   Closing pressure (cm H2O):  14   Fluid appearance:  Clear   Tubes of fluid:  4   Total volume (ml):  12 Post-procedure:    Puncture site:  Adhesive bandage applied and direct pressure applied   Patient tolerance of procedure:  Tolerated well, no immediate complications      Shaune PollackIsaacs, Christe Tellez, MD 08/13/16 1528
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
## 2016-08-13 NOTE — ED Notes (Signed)
MD to bedside for lumbar puncture

## 2016-08-13 NOTE — ED Notes (Signed)
Patient transported to MRI 

## 2016-08-13 NOTE — ED Provider Notes (Signed)
Assumed patient care form Lynn Khatri PA-C.  Patient discharge is pending visual acuity screening and tylenol.  Plan for discharge even if abnormal visual acuity, need to obtain for baseline measure.  For full physical, work up and plan see note from Stryker CorporationHina Khatri PA-C.    Patient briefly assessed, daughter in room translates.  Patient states she feels much better over all and her head feels better since she got the spinal tap.  She says she is ready to go home and understands her treatment plan and need for follow up.    Patient will be discharged in NAD, hemodynamically stable.  Return precautions discussed and patient states understanding.     Lynn Vazquez, Rhoderick Farrel W, PA-C 08/13/16 1846    Shaune PollackIsaacs, [place], MD 08/14/16 [phone]

## 2016-08-13 NOTE — ED Triage Notes (Signed)
To ed with c/o "Worst pain ever in head-- I feel like something is going to burst in my head" -- vomiting x 2 today-- pt is crying -- Language line interpretor used for assessment.

## 2016-08-16 LAB — CSF CULTURE W GRAM STAIN: Culture: NO GROWTH

## 2016-08-16 LAB — CSF CULTURE: GRAM STAIN: NONE SEEN

## 2017-06-11 ENCOUNTER — Ambulatory Visit: Payer: Self-pay | Admitting: Neurology

## 2017-08-08 ENCOUNTER — Ambulatory Visit: Payer: Self-pay | Admitting: Neurology

## 2017-08-08 ENCOUNTER — Encounter: Payer: Self-pay | Admitting: Neurology

## 2017-08-08 ENCOUNTER — Telehealth: Payer: Self-pay | Admitting: *Deleted

## 2017-08-08 NOTE — Telephone Encounter (Signed)
Pt no showed new pt appt on 08/08/2017 @ 08:00.

## 2017-10-28 ENCOUNTER — Emergency Department (HOSPITAL_COMMUNITY)
Admission: EM | Admit: 2017-10-28 | Discharge: 2017-10-29 | Disposition: A | Discharge status: Home / Self Care | Payer: Self-pay | Attending: Emergency Medicine | Admitting: Emergency Medicine

## 2017-10-28 ENCOUNTER — Other Ambulatory Visit: Payer: Self-pay

## 2017-10-28 ENCOUNTER — Encounter (HOSPITAL_COMMUNITY): Payer: Self-pay | Admitting: Emergency Medicine

## 2017-10-28 DIAGNOSIS — R739 Hyperglycemia, unspecified: Secondary | ICD-10-CM

## 2017-10-28 DIAGNOSIS — Z79899 Other long term (current) drug therapy: Secondary | ICD-10-CM | POA: Insufficient documentation

## 2017-10-28 DIAGNOSIS — R197 Diarrhea, unspecified: Secondary | ICD-10-CM

## 2017-10-28 DIAGNOSIS — R112 Nausea with vomiting, unspecified: Secondary | ICD-10-CM | POA: Insufficient documentation

## 2017-10-28 DIAGNOSIS — E1165 Type 2 diabetes mellitus with hyperglycemia: Secondary | ICD-10-CM | POA: Insufficient documentation

## 2017-10-28 DIAGNOSIS — Z7984 Long term (current) use of oral hypoglycemic drugs: Secondary | ICD-10-CM | POA: Insufficient documentation

## 2017-10-28 DIAGNOSIS — R1084 Generalized abdominal pain: Secondary | ICD-10-CM | POA: Insufficient documentation

## 2017-10-28 LAB — COMPREHENSIVE METABOLIC PANEL
ALBUMIN: 3.6 g/dL (ref 3.5–5.0)
ALT: 15 U/L (ref 0–44)
ANION GAP: 12 (ref 5–15)
AST: 19 U/L (ref 15–41)
Alkaline Phosphatase: 100 U/L (ref 38–126)
BILIRUBIN TOTAL: 1.2 mg/dL (ref 0.3–1.2)
BUN: 9 mg/dL (ref 6–20)
CO2: 23 mmol/L (ref 22–32)
Calcium: 9.2 mg/dL (ref 8.9–10.3)
Chloride: 95 mmol/L — ABNORMAL LOW (ref 98–111)
Creatinine, Ser: 0.57 mg/dL (ref 0.44–1.00)
GFR calc non Af Amer: >60 mL/min (ref >60)
Glucose, Bld: 432 mg/dL — ABNORMAL HIGH (ref 70–99)
POTASSIUM: 4.3 mmol/L (ref 3.5–5.1)
Sodium: 130 mmol/L — ABNORMAL LOW (ref 135–145)
TOTAL PROTEIN: 6.8 g/dL (ref 6.5–8.1)

## 2017-10-28 LAB — CBC
HEMATOCRIT: 41.5 % (ref 36.0–46.0)
Hemoglobin: 13.6 g/dL (ref 12.0–15.0)
MCH: 28.9 pg (ref 26.0–34.0)
MCHC: 32.8 g/dL (ref 30.0–36.0)
MCV: 88.3 fL (ref 78.0–100.0)
Platelets: 248 10*3/uL (ref 150–400)
RBC: 4.7 MIL/uL (ref 3.87–5.11)
RDW: 14.4 % (ref 11.5–15.5)
WBC: 8.9 10*3/uL (ref 4.0–10.5)

## 2017-10-28 LAB — LIPASE, BLOOD: Lipase: 38 U/L (ref 11–51)

## 2017-10-28 LAB — I-STAT BETA HCG BLOOD, ED (MC, WL, AP ONLY)

## 2017-10-28 MED ORDER — ONDANSETRON HCL 4 MG/2ML IJ SOLN
4.0000 mg | Freq: Once | INTRAMUSCULAR | Status: AC
  Administered 2017-10-29: 4 mg via INTRAVENOUS
  Filled 2017-10-28: qty 2

## 2017-10-28 MED ORDER — FAMOTIDINE IN NACL 20-0.9 MG/50ML-% IV SOLN
20.0000 mg | Freq: Once | INTRAVENOUS | Status: AC
  Administered 2017-10-29: 20 mg via INTRAVENOUS
  Filled 2017-10-28: qty 50

## 2017-10-28 MED ORDER — GI COCKTAIL ~~LOC~~
30.0000 mL | Freq: Once | ORAL | Status: AC
  Administered 2017-10-29: 30 mL via ORAL
  Filled 2017-10-28: qty 30

## 2017-10-28 MED ORDER — SODIUM CHLORIDE 0.9 % IV BOLUS
1000.0000 mL | Freq: Once | INTRAVENOUS | Status: AC
  Administered 2017-10-29: 1000 mL via INTRAVENOUS

## 2017-10-28 NOTE — ED Triage Notes (Signed)
Pt c/o abdominal pain, nausea and vomiting x 4 days. Hx diabetes, reports that her sugars have been running high.

## 2017-10-29 ENCOUNTER — Emergency Department (HOSPITAL_COMMUNITY): Payer: Self-pay

## 2017-10-29 LAB — URINALYSIS, ROUTINE W REFLEX MICROSCOPIC
BILIRUBIN URINE: NEGATIVE
HGB URINE DIPSTICK: NEGATIVE
Ketones, ur: 80 mg/dL — AB
LEUKOCYTES UA: NEGATIVE
NITRITE: NEGATIVE
PROTEIN: NEGATIVE mg/dL
Specific Gravity, Urine: 1.032 — ABNORMAL HIGH (ref 1.005–1.030)
pH: 6 (ref 5.0–8.0)

## 2017-10-29 LAB — I-STAT VENOUS BLOOD GAS, ED
ACID-BASE EXCESS: 1 mmol/L (ref 0.0–2.0)
Bicarbonate: 25 mmol/L (ref 20.0–28.0)
O2 SAT: 90 %
TCO2: 26 mmol/L (ref 22–32)
pCO2, Ven: 37.2 mmHg — ABNORMAL LOW (ref 44.0–60.0)
pH, Ven: 7.436 — ABNORMAL HIGH (ref 7.250–7.430)
pO2, Ven: 56 mmHg — ABNORMAL HIGH (ref 32.0–45.0)

## 2017-10-29 LAB — CBG MONITORING, ED: GLUCOSE-CAPILLARY: 265 mg/dL — AB (ref 70–99)

## 2017-10-29 MED ORDER — IOHEXOL 300 MG/ML  SOLN
100.0000 mL | Freq: Once | INTRAMUSCULAR | Status: AC | PRN
Start: 2017-10-29 — End: 2017-10-29
  Administered 2017-10-29: 100 mL via INTRAVENOUS

## 2017-10-29 MED ORDER — ONDANSETRON 4 MG PO TBDP: 4.0000 mg | ORAL_TABLET | Freq: Three times a day (TID) | ORAL | 0 refills | Status: DC | PRN
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
## 2017-10-29 NOTE — Discharge Instructions (Signed)
Your work-up has been reassuring in the ER today.  Your blood sugar has improved with fluids.  I suspect that your blood pressure was elevated from the nausea vomiting diarrhea.  Would suggest staying hydrated with plenty of fluids.  Please continue take your insulin as prescribed.  May use the Zofran for any nausea or vomiting.  May use Imodium if the diarrhea persist.  If you start developing fevers or bloody stools follow-up with primary care doctor may need antibiotics at that time.  Use over-the-counter acid medication such as Prilosec or Pepcid to help with acid reflux.

## 2018-11-02 ENCOUNTER — Encounter (HOSPITAL_COMMUNITY): Payer: Self-pay | Admitting: Family Medicine

## 2018-11-02 ENCOUNTER — Other Ambulatory Visit: Payer: Self-pay

## 2018-11-02 ENCOUNTER — Ambulatory Visit (HOSPITAL_COMMUNITY)
Admission: EM | Admit: 2018-11-02 | Discharge: 2018-11-02 | Disposition: A | Discharge status: Home / Self Care | Payer: Self-pay | Attending: Family Medicine | Admitting: Family Medicine

## 2018-11-02 DIAGNOSIS — N390 Urinary tract infection, site not specified: Secondary | ICD-10-CM

## 2018-11-02 DIAGNOSIS — H6501 Acute serous otitis media, right ear: Secondary | ICD-10-CM

## 2018-11-02 LAB — POCT URINALYSIS DIP (DEVICE)
Bilirubin Urine: NEGATIVE
Glucose, UA: >=1000 mg/dL — AB
Ketones, ur: 15 mg/dL — AB
Leukocytes,Ua: NEGATIVE
Nitrite: POSITIVE — AB
Protein, ur: 30 mg/dL — AB
Specific Gravity, Urine: 1.01 (ref 1.005–1.030)
Urobilinogen, UA: 1 mg/dL (ref 0.0–1.0)
pH: 5.5 (ref 5.0–8.0)

## 2018-11-02 MED ORDER — ACETAZOLAMIDE 125 MG PO TABS: 250.0000 mg | ORAL_TABLET | Freq: Two times a day (BID) | ORAL | 11 refills | Status: AC

## 2018-11-02 MED ORDER — CEPHALEXIN 500 MG PO CAPS: 500.0000 mg | ORAL_CAPSULE | Freq: Three times a day (TID) | ORAL | 0 refills | Status: DC

## 2018-11-02 MED ORDER — ACETAZOLAMIDE 125 MG PO TABS: 250.0000 mg | ORAL_TABLET | Freq: Two times a day (BID) | ORAL | 11 refills | Status: DC

## 2018-11-02 MED ORDER — NEOMYCIN-POLYMYXIN-HC 3.5-10000-1 OT SOLN: 3.0000 [drp] | Freq: Three times a day (TID) | OTIC | 0 refills | Status: DC

## 2018-11-02 NOTE — ED Notes (Signed)
Urine culture ordered and collected. I'm sending culture to main lab to be tested.
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
## 2018-11-02 NOTE — ED Triage Notes (Signed)
Pt states she has a UTI and right ear pain. X 1 week.

## 2018-11-13 ENCOUNTER — Encounter (HOSPITAL_COMMUNITY): Payer: Self-pay | Admitting: Emergency Medicine

## 2018-11-13 ENCOUNTER — Emergency Department (HOSPITAL_COMMUNITY)
Admission: EM | Admit: 2018-11-13 | Discharge: 2018-11-13 | Disposition: A | Discharge status: Home / Self Care | Payer: Self-pay | Attending: Emergency Medicine | Admitting: Emergency Medicine

## 2018-11-13 ENCOUNTER — Other Ambulatory Visit: Payer: Self-pay

## 2018-11-13 ENCOUNTER — Emergency Department (HOSPITAL_COMMUNITY): Payer: Self-pay

## 2018-11-13 DIAGNOSIS — E119 Type 2 diabetes mellitus without complications: Secondary | ICD-10-CM | POA: Insufficient documentation

## 2018-11-13 DIAGNOSIS — N739 Female pelvic inflammatory disease, unspecified: Secondary | ICD-10-CM | POA: Insufficient documentation

## 2018-11-13 DIAGNOSIS — Z79899 Other long term (current) drug therapy: Secondary | ICD-10-CM | POA: Insufficient documentation

## 2018-11-13 DIAGNOSIS — N12 Tubulo-interstitial nephritis, not specified as acute or chronic: Secondary | ICD-10-CM

## 2018-11-13 DIAGNOSIS — R102 Pelvic and perineal pain: Secondary | ICD-10-CM | POA: Insufficient documentation

## 2018-11-13 DIAGNOSIS — Z8709 Personal history of other diseases of the respiratory system: Secondary | ICD-10-CM | POA: Insufficient documentation

## 2018-11-13 DIAGNOSIS — N10 Acute tubulo-interstitial nephritis: Secondary | ICD-10-CM | POA: Insufficient documentation

## 2018-11-13 DIAGNOSIS — Z7984 Long term (current) use of oral hypoglycemic drugs: Secondary | ICD-10-CM | POA: Insufficient documentation

## 2018-11-13 LAB — URINALYSIS, ROUTINE W REFLEX MICROSCOPIC
Bilirubin Urine: NEGATIVE
Glucose, UA: >=500 mg/dL — AB
Ketones, ur: NEGATIVE mg/dL
Nitrite: POSITIVE — AB
Protein, ur: 30 mg/dL — AB
Specific Gravity, Urine: 1.027 (ref 1.005–1.030)
WBC, UA: >50 WBC/hpf — ABNORMAL HIGH (ref 0–5)
pH: 6 (ref 5.0–8.0)

## 2018-11-13 LAB — POC URINE PREG, ED: Preg Test, Ur: NEGATIVE

## 2018-11-13 LAB — WET PREP, GENITAL
Clue Cells Wet Prep HPF POC: NONE SEEN
Sperm: NONE SEEN
Trich, Wet Prep: NONE SEEN
Yeast Wet Prep HPF POC: NONE SEEN

## 2018-11-13 MED ORDER — SODIUM CHLORIDE 0.9 % IV SOLN: 1.0000 g | Freq: Once | INTRAVENOUS | Status: DC

## 2018-11-13 MED ORDER — CEPHALEXIN 500 MG PO CAPS: 500.0000 mg | ORAL_CAPSULE | Freq: Four times a day (QID) | ORAL | 0 refills | Status: DC

## 2018-11-13 MED ORDER — SULFAMETHOXAZOLE-TRIMETHOPRIM 800-160 MG PO TABS: 1.0000 | ORAL_TABLET | Freq: Two times a day (BID) | ORAL | 0 refills | Status: AC

## 2018-11-13 MED ORDER — DOXYCYCLINE HYCLATE 100 MG PO CAPS: 100.0000 mg | ORAL_CAPSULE | Freq: Two times a day (BID) | ORAL | 0 refills | Status: AC

## 2018-11-13 MED ORDER — AZITHROMYCIN 250 MG PO TABS
1000.0000 mg | ORAL_TABLET | Freq: Once | ORAL | Status: AC
  Administered 2018-11-13: 16:00:00 1000 mg via ORAL
  Filled 2018-11-13: qty 4

## 2018-11-13 MED ORDER — CEFTRIAXONE SODIUM 1 G IJ SOLR
500.0000 mg | Freq: Once | INTRAMUSCULAR | Status: AC
  Administered 2018-11-13: 16:00:00 500 mg via INTRAMUSCULAR
  Filled 2018-11-13 (×2): qty 10

## 2018-11-13 MED ORDER — LIDOCAINE HCL (PF) 1 % IJ SOLN
INTRAMUSCULAR | Status: AC
  Filled 2018-11-13: qty 5

## 2018-11-13 MED ORDER — HYDROCODONE-ACETAMINOPHEN 5-325 MG PO TABS
1.0000 | ORAL_TABLET | Freq: Once | ORAL | Status: AC
  Administered 2018-11-13: 16:00:00 1 via ORAL
  Filled 2018-11-13: qty 1

## 2018-11-13 NOTE — ED Triage Notes (Signed)
Pt reports 3 week hx of dysuria, vaginal pain and discharge. Pt reports having taking antibiotics for same but symptoms have returned.

## 2018-11-13 NOTE — ED Notes (Signed)
Patient verbalizes understanding of discharge instructions. Opportunity for questioning and answers were provided. Armband removed by staff, pt discharged from ED.  
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
## 2018-11-13 NOTE — Discharge Instructions (Addendum)
Take antibiotics as directed. Please take all of your antibiotics until finished.  As we discussed, your ultrasound did show a left ovarian cyst.  Please follow-up with OB/GYN regarding this.  This most likely is not causing your pain.  Return the emergency department for any fevers, vomiting, worsening pain or any other worsening or concerning symptoms.

## 2018-11-14 LAB — GC/CHLAMYDIA PROBE AMP (~~LOC~~) NOT AT ARMC
Chlamydia: NEGATIVE
Neisseria Gonorrhea: NEGATIVE

## 2018-11-15 LAB — URINE CULTURE: Culture: >=100000 — AB

## 2018-11-16 ENCOUNTER — Telehealth: Payer: Self-pay | Admitting: Emergency Medicine

## 2018-11-16 NOTE — Progress Notes (Signed)
ED Antimicrobial Stewardship Positive Culture Follow Up   Lynn Vazquez is an 43 y.o. female who presented to [facility] on 11/13/2018 with a chief complaint of  Chief Complaint  Patient presents with  . Dysuria  . Vaginal Discharge    Recent Results (from the past 720 hour(s))  Urine culture     Status: Abnormal   Collection Time: 11/13/18 12:29 PM   Specimen: Urine, Clean Catch  Result Value Ref Range Status   Specimen Description URINE, CLEAN CATCH  Final   Special Requests   Final    NONE Performed at [facility], [place] [address] [place], [place] [address]    Culture >=100,000 COLONIES/mL ESCHERICHIA COLI (A)  Final   Report Status 11/15/2018 FINAL  Final   Organism ID, Bacteria ESCHERICHIA COLI (A)  Final      Susceptibility   Escherichia coli - MIC*    AMPICILLIN >=32 RESISTANT Resistant     CEFAZOLIN <=4 SENSITIVE Sensitive     CEFTRIAXONE <=1 SENSITIVE Sensitive     CIPROFLOXACIN 1 SENSITIVE Sensitive     GENTAMICIN <=1 SENSITIVE Sensitive     IMIPENEM <=0.25 SENSITIVE Sensitive     NITROFURANTOIN <=16 SENSITIVE Sensitive     TRIMETH/SULFA >=320 RESISTANT Resistant     AMPICILLIN/SULBACTAM 8 SENSITIVE Sensitive     PIP/TAZO <=4 SENSITIVE Sensitive     Extended ESBL NEGATIVE Sensitive     * >=100,000 COLONIES/mL ESCHERICHIA COLI  Wet prep, genital     Status: Abnormal   Collection Time: 11/13/18  1:07 PM   Specimen: Thin Prep Cervical/Endocervical  Result Value Ref Range Status   Yeast Wet Prep HPF POC NONE SEEN NONE SEEN Final   Trich, Wet Prep NONE SEEN NONE SEEN Final   Clue Cells Wet Prep HPF POC NONE SEEN NONE SEEN Final   WBC, Wet Prep HPF POC MANY (A) NONE SEEN Final   Sperm NONE SEEN  Final    Comment: Performed at [facility], [place] [address], [place], [place] [address]    [x]  Treated with bactrim, organism resistant to prescribed antimicrobial []  Patient discharged originally without antimicrobial agent and treatment is  now indicated  New antibiotic prescription: DC bactrim, start cefdinir 300mg  PO BID x 10 days  ED Provider: Benedetto Goad, PA   Chalon Zobrist, Rande Lawman 11/16/2018, 9:28 AM Clinical Pharmacist Monday - Friday phone -  [phone] Saturday - Sunday phone - [phone]

## 2018-11-16 NOTE — Telephone Encounter (Signed)
Post ED Visit - Positive Culture Follow-up: Unsuccessful Patient Follow-up  Culture assessed and recommendations reviewed by:  []  Elenor Quinones, Pharm.D. []  Heide Guile, Pharm.D., BCPS AQ-ID []  Parks Neptune, Pharm.D., BCPS []  Alycia Rossetti, Pharm.D., BCPS []  Moore, Pharm.D., BCPS, AAHIVP []  Legrand Como, Pharm.D., BCPS, AAHIVP []  Wynell Balloon, PharmD [x]  Salome Arnt, PharmD, BCPS  Positive urine culture  []  Patient discharged without antimicrobial prescription and treatment is now indicated [x]  Organism is resistant to prescribed ED discharge antimicrobial []  Patient with positive blood cultures   Unable to contact patient by phone, letter will be sent to address on file.  Plan: D/c Bactrim, start Cefdinir 300 mg PO BID for ten days, Benedetto Goad PA  Larene Beach C Mylie Mccurley 11/16/2018, 3:08 PM

## 2018-12-11 ENCOUNTER — Telehealth: Payer: Self-pay | Admitting: Family Medicine

## 2018-12-11 NOTE — Telephone Encounter (Signed)
Patient appointment had to be rescheduled due to changes in the office.

## 2018-12-12 ENCOUNTER — Encounter: Payer: Self-pay | Admitting: Obstetrics and Gynecology

## 2019-01-02 ENCOUNTER — Ambulatory Visit (INDEPENDENT_AMBULATORY_CARE_PROVIDER_SITE_OTHER): Payer: Self-pay | Admitting: Obstetrics and Gynecology

## 2019-01-02 ENCOUNTER — Encounter: Payer: Self-pay | Admitting: Obstetrics and Gynecology

## 2019-01-02 ENCOUNTER — Other Ambulatory Visit: Payer: Self-pay

## 2019-01-02 VITALS — BP 135/92 | HR 83 | Ht 64.0 in | Wt 201.5 lb

## 2019-01-02 DIAGNOSIS — E669 Obesity, unspecified: Secondary | ICD-10-CM | POA: Insufficient documentation

## 2019-01-02 DIAGNOSIS — B373 Candidiasis of vulva and vagina: Secondary | ICD-10-CM

## 2019-01-02 DIAGNOSIS — B3731 Acute candidiasis of vulva and vagina: Secondary | ICD-10-CM

## 2019-01-02 DIAGNOSIS — R102 Pelvic and perineal pain: Secondary | ICD-10-CM

## 2019-01-02 DIAGNOSIS — Z789 Other specified health status: Secondary | ICD-10-CM

## 2019-01-02 DIAGNOSIS — Z87440 Personal history of urinary (tract) infections: Secondary | ICD-10-CM

## 2019-01-02 DIAGNOSIS — N760 Acute vaginitis: Secondary | ICD-10-CM

## 2019-01-02 DIAGNOSIS — B9689 Other specified bacterial agents as the cause of diseases classified elsewhere: Secondary | ICD-10-CM

## 2019-01-02 DIAGNOSIS — R739 Hyperglycemia, unspecified: Secondary | ICD-10-CM | POA: Insufficient documentation

## 2019-01-02 LAB — GLUCOSE, CAPILLARY: Glucose-Capillary: 574 mg/dL (ref 70–99)

## 2019-01-02 MED ORDER — MICONAZOLE NITRATE 2 % VA CREA: TOPICAL_CREAM | VAGINAL | 2 refills | Status: DC

## 2019-01-02 MED ORDER — FLUCONAZOLE 150 MG PO TABS: 150.0000 mg | ORAL_TABLET | ORAL | 0 refills | Status: AC

## 2019-01-02 MED ORDER — METRONIDAZOLE 500 MG PO TABS: 500.0000 mg | ORAL_TABLET | Freq: Two times a day (BID) | ORAL | 0 refills | Status: AC

## 2019-01-02 NOTE — Progress Notes (Signed)
Pt states has been having pelvic pain off & on since she came to hospital.Pian & burning, and some itching.
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
## 2019-05-14 ENCOUNTER — Encounter (HOSPITAL_COMMUNITY): Payer: Self-pay | Admitting: Emergency Medicine

## 2019-05-14 ENCOUNTER — Emergency Department (HOSPITAL_COMMUNITY)
Admission: EM | Admit: 2019-05-14 | Discharge: 2019-05-14 | Disposition: A | Discharge status: Home / Self Care | Payer: Self-pay | Attending: Emergency Medicine | Admitting: Emergency Medicine

## 2019-05-14 ENCOUNTER — Other Ambulatory Visit: Payer: Self-pay

## 2019-05-14 DIAGNOSIS — L0231 Cutaneous abscess of buttock: Secondary | ICD-10-CM | POA: Insufficient documentation

## 2019-05-14 DIAGNOSIS — Z7984 Long term (current) use of oral hypoglycemic drugs: Secondary | ICD-10-CM | POA: Insufficient documentation

## 2019-05-14 DIAGNOSIS — Z79899 Other long term (current) drug therapy: Secondary | ICD-10-CM | POA: Insufficient documentation

## 2019-05-14 DIAGNOSIS — E119 Type 2 diabetes mellitus without complications: Secondary | ICD-10-CM | POA: Insufficient documentation

## 2019-05-14 DIAGNOSIS — Z791 Long term (current) use of non-steroidal anti-inflammatories (NSAID): Secondary | ICD-10-CM | POA: Insufficient documentation

## 2019-05-14 LAB — CBG MONITORING, ED: Glucose-Capillary: 353 mg/dL — ABNORMAL HIGH (ref 70–99)

## 2019-05-14 MED ORDER — DOXYCYCLINE HYCLATE 100 MG PO CAPS: 100.0000 mg | ORAL_CAPSULE | Freq: Two times a day (BID) | ORAL | 0 refills | Status: DC

## 2019-05-14 MED ORDER — OXYCODONE-ACETAMINOPHEN 5-325 MG PO TABS
1.0000 | ORAL_TABLET | Freq: Once | ORAL | Status: AC
  Administered 2019-05-14: 16:00:00 1 via ORAL
  Filled 2019-05-14: qty 1

## 2019-05-14 MED ORDER — LIDOCAINE HCL (PF) 1 % IJ SOLN
5.0000 mL | Freq: Once | INTRAMUSCULAR | Status: DC
  Filled 2019-05-14: qty 5

## 2019-05-14 NOTE — ED Triage Notes (Signed)
Pt is spanish speaking only- triage via spanish interpreter. Pt states she noticed a painful bruise on her buttocks. Pt denies falling- does not recall what caused the bruise.

## 2019-05-14 NOTE — Discharge Instructions (Addendum)
  Tome antibitico segn lo prescrito. Aplique una compresa tibia en el rea durante 30 minutos tres veces al [employer]. Haga un seguimiento con su mdico maana. Retiro del embalaje maana o sbado. Si no puede ver a su mdico maana, haga un seguimiento en el centro de atencin de urgencia el sbado para volver a verificar. Regrese a la sala de emergencias si tiene fiebre, empeoramiento del dolor u otras preocupaciones.

## 2019-05-14 NOTE — ED Notes (Signed)
Used interpreter to explain D/C instructions
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
## 2020-01-13 ENCOUNTER — Encounter (HOSPITAL_COMMUNITY): Payer: Self-pay | Admitting: Emergency Medicine

## 2020-01-13 ENCOUNTER — Other Ambulatory Visit: Payer: Self-pay

## 2020-01-13 ENCOUNTER — Emergency Department (HOSPITAL_COMMUNITY)
Admission: EM | Admit: 2020-01-13 | Discharge: 2020-01-13 | Disposition: A | Discharge status: Home / Self Care | Payer: Self-pay | Attending: Emergency Medicine | Admitting: Emergency Medicine

## 2020-01-13 DIAGNOSIS — Z5321 Procedure and treatment not carried out due to patient leaving prior to being seen by health care provider: Secondary | ICD-10-CM | POA: Insufficient documentation

## 2020-01-13 DIAGNOSIS — E1165 Type 2 diabetes mellitus with hyperglycemia: Secondary | ICD-10-CM | POA: Insufficient documentation

## 2020-01-13 DIAGNOSIS — L02214 Cutaneous abscess of groin: Secondary | ICD-10-CM | POA: Insufficient documentation

## 2020-01-13 LAB — BASIC METABOLIC PANEL
Anion gap: 12 (ref 5–15)
BUN: 8 mg/dL (ref 6–20)
CO2: 22 mmol/L (ref 22–32)
Calcium: 8.9 mg/dL (ref 8.9–10.3)
Chloride: 93 mmol/L — ABNORMAL LOW (ref 98–111)
Creatinine, Ser: 0.56 mg/dL (ref 0.44–1.00)
GFR, Estimated: >60 mL/min (ref >60)
Glucose, Bld: 436 mg/dL — ABNORMAL HIGH (ref 70–99)
Potassium: 3.5 mmol/L (ref 3.5–5.1)
Sodium: 127 mmol/L — ABNORMAL LOW (ref 135–145)

## 2020-01-13 LAB — CBC
HCT: 40.6 % (ref 36.0–46.0)
Hemoglobin: 13.5 g/dL (ref 12.0–15.0)
MCH: 30 pg (ref 26.0–34.0)
MCHC: 33.3 g/dL (ref 30.0–36.0)
MCV: 90.2 fL (ref 80.0–100.0)
Platelets: 277 10*3/uL (ref 150–400)
RBC: 4.5 MIL/uL (ref 3.87–5.11)
RDW: 12.7 % (ref 11.5–15.5)
WBC: 9.1 10*3/uL (ref 4.0–10.5)
nRBC: 0 % (ref 0.0–0.2)

## 2020-01-13 NOTE — ED Notes (Signed)
Called pt x4 for room, no response. 

## 2020-01-13 NOTE — ED Triage Notes (Signed)
Pt reports abscess to R groin x 3 days.  Reports blood sugar 500 at home.  States she has been out of diabetes meds x 1 week and trying to see PCP.

## 2020-02-07 ENCOUNTER — Ambulatory Visit (HOSPITAL_COMMUNITY)
Admission: EM | Admit: 2020-02-07 | Discharge: 2020-02-07 | Disposition: A | Discharge status: Home / Self Care | Payer: Self-pay | Attending: Emergency Medicine | Admitting: Emergency Medicine

## 2020-02-07 ENCOUNTER — Other Ambulatory Visit: Payer: Self-pay

## 2020-02-07 ENCOUNTER — Encounter (HOSPITAL_COMMUNITY): Payer: Self-pay | Admitting: Emergency Medicine

## 2020-02-07 DIAGNOSIS — N898 Other specified noninflammatory disorders of vagina: Secondary | ICD-10-CM

## 2020-02-07 DIAGNOSIS — B3731 Acute candidiasis of vulva and vagina: Secondary | ICD-10-CM

## 2020-02-07 DIAGNOSIS — B373 Candidiasis of vulva and vagina: Secondary | ICD-10-CM | POA: Insufficient documentation

## 2020-02-07 DIAGNOSIS — E1165 Type 2 diabetes mellitus with hyperglycemia: Secondary | ICD-10-CM | POA: Insufficient documentation

## 2020-02-07 DIAGNOSIS — Z3202 Encounter for pregnancy test, result negative: Secondary | ICD-10-CM

## 2020-02-07 LAB — POCT URINALYSIS DIPSTICK, ED / UC
Bilirubin Urine: NEGATIVE
Glucose, UA: >=1000 mg/dL — AB
Ketones, ur: 40 mg/dL — AB
Leukocytes,Ua: NEGATIVE
Nitrite: NEGATIVE
Protein, ur: 30 mg/dL — AB
Specific Gravity, Urine: 1.01 (ref 1.005–1.030)
Urobilinogen, UA: 0.2 mg/dL (ref 0.0–1.0)
pH: 5.5 (ref 5.0–8.0)

## 2020-02-07 LAB — POC URINE PREG, ED: Preg Test, Ur: NEGATIVE

## 2020-02-07 LAB — CBG MONITORING, ED: Glucose-Capillary: 337 mg/dL — ABNORMAL HIGH (ref 70–99)

## 2020-02-07 MED ORDER — NYSTATIN 100000 UNIT/GM EX CREA: TOPICAL_CREAM | CUTANEOUS | 0 refills | Status: DC

## 2020-02-07 MED ORDER — FLUCONAZOLE 150 MG PO TABS: 150.0000 mg | ORAL_TABLET | Freq: Every day | ORAL | 0 refills | Status: DC

## 2020-02-07 NOTE — Discharge Instructions (Signed)
Take the Diflucan and use the nystatin cream as directed.    Your primary care provider tomorrow morning to schedule the soonest available appointment to discuss your uncontrolled diabetes with hyperglycemia.
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
## 2020-02-07 NOTE — ED Triage Notes (Signed)
Vaginal area is sore, painful, irritated.  Feels pain with urination.  Patient has noticed a pimple

## 2020-02-08 ENCOUNTER — Telehealth: Payer: Self-pay | Admitting: Emergency Medicine

## 2020-02-08 LAB — CERVICOVAGINAL ANCILLARY ONLY
Bacterial Vaginitis (gardnerella): POSITIVE — AB
Candida Glabrata: POSITIVE — AB
Candida Vaginitis: POSITIVE — AB
Chlamydia: NEGATIVE
Comment: NEGATIVE
Comment: NEGATIVE
Comment: NEGATIVE
Comment: NEGATIVE
Comment: NEGATIVE
Comment: NORMAL
Neisseria Gonorrhea: NEGATIVE
Trichomonas: NEGATIVE

## 2020-02-08 MED ORDER — METRONIDAZOLE 500 MG PO TABS: 500.0000 mg | ORAL_TABLET | Freq: Two times a day (BID) | ORAL | 0 refills | Status: DC

## 2020-08-27 ENCOUNTER — Emergency Department (HOSPITAL_COMMUNITY)
Admission: EM | Admit: 2020-08-27 | Discharge: 2020-08-27 | Disposition: A | Discharge status: Home / Self Care | Payer: Self-pay | Attending: Emergency Medicine | Admitting: Emergency Medicine

## 2020-08-27 ENCOUNTER — Other Ambulatory Visit: Payer: Self-pay

## 2020-08-27 DIAGNOSIS — M62838 Other muscle spasm: Secondary | ICD-10-CM | POA: Insufficient documentation

## 2020-08-27 DIAGNOSIS — N3090 Cystitis, unspecified without hematuria: Secondary | ICD-10-CM | POA: Insufficient documentation

## 2020-08-27 DIAGNOSIS — B9689 Other specified bacterial agents as the cause of diseases classified elsewhere: Secondary | ICD-10-CM | POA: Insufficient documentation

## 2020-08-27 DIAGNOSIS — E119 Type 2 diabetes mellitus without complications: Secondary | ICD-10-CM | POA: Insufficient documentation

## 2020-08-27 DIAGNOSIS — N390 Urinary tract infection, site not specified: Secondary | ICD-10-CM

## 2020-08-27 DIAGNOSIS — M79602 Pain in left arm: Secondary | ICD-10-CM

## 2020-08-27 DIAGNOSIS — Z7984 Long term (current) use of oral hypoglycemic drugs: Secondary | ICD-10-CM | POA: Insufficient documentation

## 2020-08-27 DIAGNOSIS — M79605 Pain in left leg: Secondary | ICD-10-CM

## 2020-08-27 LAB — BASIC METABOLIC PANEL
Anion gap: 8 (ref 5–15)
BUN: 11 mg/dL (ref 6–20)
CO2: 23 mmol/L (ref 22–32)
Calcium: 9.5 mg/dL (ref 8.9–10.3)
Chloride: 102 mmol/L (ref 98–111)
Creatinine, Ser: 0.53 mg/dL (ref 0.44–1.00)
GFR, Estimated: >60 mL/min (ref >60)
Glucose, Bld: 380 mg/dL — ABNORMAL HIGH (ref 70–99)
Potassium: 4.1 mmol/L (ref 3.5–5.1)
Sodium: 133 mmol/L — ABNORMAL LOW (ref 135–145)

## 2020-08-27 LAB — URINALYSIS, ROUTINE W REFLEX MICROSCOPIC
Bilirubin Urine: NEGATIVE
Glucose, UA: >=500 mg/dL — AB
Hgb urine dipstick: NEGATIVE
Ketones, ur: NEGATIVE mg/dL
Nitrite: NEGATIVE
Protein, ur: 100 mg/dL — AB
Specific Gravity, Urine: 1.028 (ref 1.005–1.030)
pH: 5 (ref 5.0–8.0)

## 2020-08-27 LAB — CBG MONITORING, ED: Glucose-Capillary: 436 mg/dL — ABNORMAL HIGH (ref 70–99)

## 2020-08-27 LAB — CBC WITH DIFFERENTIAL/PLATELET
Abs Immature Granulocytes: 0.04 10*3/uL (ref 0.00–0.07)
Basophils Absolute: 0.1 10*3/uL (ref 0.0–0.1)
Basophils Relative: 1 %
Eosinophils Absolute: 0.5 10*3/uL (ref 0.0–0.5)
Eosinophils Relative: 5 %
HCT: 40.9 % (ref 36.0–46.0)
Hemoglobin: 13.5 g/dL (ref 12.0–15.0)
Immature Granulocytes: 0 %
Lymphocytes Relative: 20 %
Lymphs Abs: 2.3 10*3/uL (ref 0.7–4.0)
MCH: 29.1 pg (ref 26.0–34.0)
MCHC: 33 g/dL (ref 30.0–36.0)
MCV: 88.1 fL (ref 80.0–100.0)
Monocytes Absolute: 0.6 10*3/uL (ref 0.1–1.0)
Monocytes Relative: 5 %
Neutro Abs: 8.1 10*3/uL — ABNORMAL HIGH (ref 1.7–7.7)
Neutrophils Relative %: 69 %
Platelets: 332 10*3/uL (ref 150–400)
RBC: 4.64 MIL/uL (ref 3.87–5.11)
RDW: 13.2 % (ref 11.5–15.5)
WBC: 11.6 10*3/uL — ABNORMAL HIGH (ref 4.0–10.5)
nRBC: 0 % (ref 0.0–0.2)

## 2020-08-27 MED ORDER — KETOROLAC TROMETHAMINE 15 MG/ML IJ SOLN
15.0000 mg | Freq: Once | INTRAMUSCULAR | Status: AC
  Administered 2020-08-27: 15 mg via INTRAMUSCULAR
  Filled 2020-08-27: qty 1

## 2020-08-27 MED ORDER — METHOCARBAMOL 500 MG PO TABS: 500.0000 mg | ORAL_TABLET | Freq: Two times a day (BID) | ORAL | 0 refills | Status: AC

## 2020-08-27 MED ORDER — NAPROXEN 500 MG PO TABS: 500.0000 mg | ORAL_TABLET | Freq: Two times a day (BID) | ORAL | 0 refills | Status: AC

## 2020-08-27 MED ORDER — METHOCARBAMOL 500 MG PO TABS
500.0000 mg | ORAL_TABLET | Freq: Once | ORAL | Status: AC
  Administered 2020-08-27: 500 mg via ORAL
  Filled 2020-08-27: qty 1

## 2020-08-27 MED ORDER — CEPHALEXIN 500 MG PO CAPS: 500.0000 mg | ORAL_CAPSULE | Freq: Four times a day (QID) | ORAL | 0 refills | Status: AC

## 2020-08-27 NOTE — ED Provider Notes (Signed)
Emergency Medicine Provider Triage Evaluation Note  Lynn Vazquez , a 45 y.o. female  was evaluated in triage.  Pt complains of tingling sensation and pain to the left arm and leg.  Leg has been going on for the past month.  Arm started a few days ago.  No injury or trauma.  States that she feels like her extremities are falling asleep and sometimes have a sharp shooting pain.  Denies any facial symptoms, numbness or weakness or swelling.  Review of Systems  Positive: Paresthesias Negative: Extremities swelling  Physical Exam  BP (!) 137/104 (BP Location: Right Arm)   Pulse 92   Temp 98.3 F (36.8 C) (Oral)   Resp 16   Ht 5\' 2"  (1.575 m)   Wt 103.9 kg   LMP 07/30/2020   SpO2 99%   BMI 41.88 kg/m  Gen:   Awake, no distress   Resp:  Normal effort  MSK:   Moves extremities without difficulty  Other:  Normal sensation to light touch of bilateral upper and lower extremities.  Normal strength  Medical Decision Making  Medically screening exam initiated at 12:11 PM.  Appropriate orders placed.  Zyair Rhein was informed that the remainder of the evaluation will be completed by another provider, this initial triage assessment does not replace that evaluation, and the importance of remaining in the ED until their evaluation is complete.  Lab work ordered   [name], PA-C 08/27/20 1212    10/27/20, MD 08/28/20 1505
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
## 2020-08-27 NOTE — ED Triage Notes (Signed)
Patient reports left arm pain x1 month and says arm "goes to sleep" when patient goes to sleep. Left leg pain starting two days ago. Denies injury. Pain is 10/10

## 2020-08-27 NOTE — Discharge Instructions (Addendum)
Toma keflex 4 veces cada dia por infeccion urinaria.  Toma naproxen 2 veces cada dia por dolor e inflamacion.  Toma robaxin para relajar los musculos.  Da Neomia Dear cita con el doctor primario.  Da Neomia Dear cita con el doctor ortopedico si tiene dolor del brazo y pierna.  Regresa a la sala de emergencia con nuevas sintomas.

## 2020-08-30 LAB — URINE CULTURE: Culture: >=100000 — AB

## 2020-08-31 ENCOUNTER — Telehealth: Payer: Self-pay | Admitting: Emergency Medicine

## 2020-08-31 NOTE — Telephone Encounter (Signed)
Post ED Visit - Positive Culture Follow-up  Culture report reviewed by antimicrobial stewardship pharmacist: [facility] []  , Pharm.D. []  Enzo Bi, Pharm.D., BCPS AQ-ID []  , Pharm.D., BCPS []  Celedonio Miyamoto, Pharm.D., BCPS []  [place], Garvin Fila.D., BCPS, AAHIVP []  , Pharm.D., BCPS, AAHIVP []  Georgina Pillion, PharmD, BCPS []  , PharmD, BCPS []  [place], PharmD, BCPS []  [address], PharmD []  , PharmD, BCPS []  Estella Husk, PharmD  Pharmacy Team []  Lysle Pearl, PharmD []  , PharmD []  Phillips Climes, PharmD []  , Rph []  Agapito Games) , PharmD []  Verlan Friends, PharmD []  , PharmD []  Mervyn Gay, PharmD []  , PharmD []  Vinnie Level, PharmD []  [facility], PharmD []  , PharmD []  Len Childs, PharmD   Positive urine culture Treated with cephalexin, organism sensitive to the same and no further patient follow-up is required at this time.  08/31/2020, 9:57 AM

## 2021-08-11 ENCOUNTER — Encounter (HOSPITAL_COMMUNITY): Payer: Self-pay | Admitting: Emergency Medicine

## 2021-08-11 ENCOUNTER — Emergency Department (HOSPITAL_COMMUNITY): Payer: Self-pay

## 2021-08-11 ENCOUNTER — Ambulatory Visit (HOSPITAL_COMMUNITY)
Admission: EM | Admit: 2021-08-11 | Discharge: 2021-08-11 | Disposition: A | Discharge status: Home / Self Care | Payer: Self-pay

## 2021-08-11 ENCOUNTER — Other Ambulatory Visit: Payer: Self-pay

## 2021-08-11 ENCOUNTER — Emergency Department (HOSPITAL_COMMUNITY)
Admission: EM | Admit: 2021-08-11 | Discharge: 2021-08-11 | Disposition: A | Discharge status: Home / Self Care | Payer: Self-pay | Attending: Emergency Medicine | Admitting: Emergency Medicine

## 2021-08-11 DIAGNOSIS — R7309 Other abnormal glucose: Secondary | ICD-10-CM | POA: Insufficient documentation

## 2021-08-11 DIAGNOSIS — R55 Syncope and collapse: Secondary | ICD-10-CM | POA: Insufficient documentation

## 2021-08-11 DIAGNOSIS — R202 Paresthesia of skin: Secondary | ICD-10-CM

## 2021-08-11 DIAGNOSIS — R42 Dizziness and giddiness: Secondary | ICD-10-CM

## 2021-08-11 DIAGNOSIS — M5030 Other cervical disc degeneration, unspecified cervical region: Secondary | ICD-10-CM | POA: Insufficient documentation

## 2021-08-11 DIAGNOSIS — Z7984 Long term (current) use of oral hypoglycemic drugs: Secondary | ICD-10-CM | POA: Insufficient documentation

## 2021-08-11 DIAGNOSIS — R519 Headache, unspecified: Secondary | ICD-10-CM | POA: Insufficient documentation

## 2021-08-11 LAB — CBC WITH DIFFERENTIAL/PLATELET
Abs Immature Granulocytes: 0.01 10*3/uL (ref 0.00–0.07)
Basophils Absolute: 0.1 10*3/uL (ref 0.0–0.1)
Basophils Relative: 1 %
Eosinophils Absolute: 0.4 10*3/uL (ref 0.0–0.5)
Eosinophils Relative: 5 %
HCT: 40.6 % (ref 36.0–46.0)
Hemoglobin: 12.9 g/dL (ref 12.0–15.0)
Immature Granulocytes: 0 %
Lymphocytes Relative: 25 %
Lymphs Abs: 2.1 10*3/uL (ref 0.7–4.0)
MCH: 27.3 pg (ref 26.0–34.0)
MCHC: 31.8 g/dL (ref 30.0–36.0)
MCV: 85.8 fL (ref 80.0–100.0)
Monocytes Absolute: 0.5 10*3/uL (ref 0.1–1.0)
Monocytes Relative: 6 %
Neutro Abs: 5.4 10*3/uL (ref 1.7–7.7)
Neutrophils Relative %: 63 %
Platelets: 304 10*3/uL (ref 150–400)
RBC: 4.73 MIL/uL (ref 3.87–5.11)
RDW: 13.2 % (ref 11.5–15.5)
WBC: 8.5 10*3/uL (ref 4.0–10.5)
nRBC: 0 % (ref 0.0–0.2)

## 2021-08-11 LAB — COMPREHENSIVE METABOLIC PANEL
ALT: 17 U/L (ref 0–44)
AST: 17 U/L (ref 15–41)
Albumin: 3.5 g/dL (ref 3.5–5.0)
Alkaline Phosphatase: 127 U/L — ABNORMAL HIGH (ref 38–126)
Anion gap: 11 (ref 5–15)
BUN: 9 mg/dL (ref 6–20)
CO2: 23 mmol/L (ref 22–32)
Calcium: 9.4 mg/dL (ref 8.9–10.3)
Chloride: 100 mmol/L (ref 98–111)
Creatinine, Ser: 0.46 mg/dL (ref 0.44–1.00)
GFR, Estimated: >60 mL/min (ref >60)
Glucose, Bld: 376 mg/dL — ABNORMAL HIGH (ref 70–99)
Potassium: 3.5 mmol/L (ref 3.5–5.1)
Sodium: 134 mmol/L — ABNORMAL LOW (ref 135–145)
Total Bilirubin: 0.6 mg/dL (ref 0.3–1.2)
Total Protein: 7 g/dL (ref 6.5–8.1)

## 2021-08-11 LAB — I-STAT CHEM 8, ED
BUN: 11 mg/dL (ref 6–20)
Calcium, Ion: 1.07 mmol/L — ABNORMAL LOW (ref 1.15–1.40)
Chloride: 99 mmol/L (ref 98–111)
Creatinine, Ser: 0.3 mg/dL — ABNORMAL LOW (ref 0.44–1.00)
Glucose, Bld: 387 mg/dL — ABNORMAL HIGH (ref 70–99)
HCT: 41 % (ref 36.0–46.0)
Hemoglobin: 13.9 g/dL (ref 12.0–15.0)
Potassium: 3.7 mmol/L (ref 3.5–5.1)
Sodium: 133 mmol/L — ABNORMAL LOW (ref 135–145)
TCO2: 23 mmol/L (ref 22–32)

## 2021-08-11 LAB — I-STAT BETA HCG BLOOD, ED (MC, WL, AP ONLY): I-stat hCG, quantitative: <5 m[IU]/mL (ref <5)

## 2021-08-11 LAB — CBG MONITORING, ED: Glucose-Capillary: 388 mg/dL — ABNORMAL HIGH (ref 70–99)

## 2021-08-11 LAB — TROPONIN I (HIGH SENSITIVITY): Troponin I (High Sensitivity): 4 ng/L (ref <18)

## 2021-08-11 LAB — PROTIME-INR
INR: 1 (ref 0.8–1.2)
Prothrombin Time: 12.6 seconds (ref 11.4–15.2)

## 2021-08-11 MED ORDER — LORAZEPAM 2 MG/ML IJ SOLN
0.5000 mg | Freq: Once | INTRAMUSCULAR | Status: AC | PRN
  Administered 2021-08-11: 0.5 mg via INTRAVENOUS
  Filled 2021-08-11: qty 1

## 2021-08-11 NOTE — ED Provider Notes (Signed)
Patient seen for dizziness since yesterday, LOC x 1, and left arm tingling today.  Vitals stable;  EKG normal;  BS 388 (known diabetic) Normal strength to the left arm.  No drift;  Alert and oriented.  Advised that given her symptoms  she would be best treated by going to the ER for evaluation.  She does have her daughter to drive her.  She agrees with this plan   Jannifer Franklin, MD 08/11/21 [phone]

## 2021-08-11 NOTE — ED Triage Notes (Signed)
Patient coming from home, complaint of dizziness and syncopal episodes yesterday and fell. Also endorses left arm numbness.

## 2021-08-11 NOTE — ED Notes (Signed)
Patient is being discharged from the Urgent Care and sent to the Emergency Department via POV. Per Dr. Haynes Bast, patient is in need of higher level of care due to dizziness and syncope episode. Patient is aware and verbalizes understanding of plan of care.  Vitals:   08/11/21 0939  BP: (!) 150/82  Pulse: 76  Resp: 18  Temp: 98.1 F (36.7 C)  SpO2: 100%

## 2021-08-11 NOTE — Discharge Instructions (Signed)
Return for any problem.  ?

## 2021-08-11 NOTE — ED Triage Notes (Signed)
Pt reports feeling dizzy for a week, syncope episode yesterday (states had a LOC), and left arm numbness. States arm feels like ants are crawling on her.

## 2021-08-11 NOTE — ED Provider Notes (Signed)
[facility] EMERGENCY DEPARTMENT Provider Note   CSN: [id number] Arrival date & time: 08/11/21  [id number]     History  Chief Complaint  Patient presents with   Weakness   Fall    Lynn Vazquez is a 46 y.o. female.  46 year old female with prior medical history as detailed below presents for evaluation.  Patient reports tingling discomfort of the left arm for the last week.  It is constant.  Patient reports that the symptoms started approximately 7 days ago.  She thought that she had slept on it wrong which is why she did not see anybody about it.  Yesterday she reports 2 episodes of dizziness that was severe enough to lead to brief LOC.  She reports that she passed out twice yesterday.  Both syncopal events resulted in ground-level falls.  She denies any specific injury from her passing out yesterday.  She complains that since her syncopal events yesterday she has had pain to the back of her head and back of her neck.  She reports that the tingling discomfort in her left shoulder and left arm are still present.  They have not worsened.  She denies weakness in the left arm.  She denies difficulty with vision or speech.  She denies difficulty ambulating.  She denies associated chest pain or shortness of breath.  She apparently presented to urgent care earlier today and was referred to the ED for evaluation.  The history is provided by the patient and medical records.  Illness Location:  Tingling discomfort in the left upper extremity, syncope x2, dizziness Severity:  Moderate Onset quality:  Gradual Duration:  1 week Progression:  Unchanged Chronicity:  New     Home Medications Prior to Admission medications   Medication Sig Start Date End Date Taking? Authorizing Provider  acetaminophen (TYLENOL) 325 MG tablet Take 650 mg by mouth every 6 (six) hours as needed for mild pain, moderate pain or headache.    [provider]  acetaZOLAMIDE  (DIAMOX) 125 MG tablet Take 2 tablets (250 mg total) by mouth 2 (two) times daily. 11/02/18 12/02/18  Robyn Haber, MD  Ca Carbonate-Mag Hydroxide (ROLAIDS) 550-110 MG CHEW Chew 1 tablet by mouth daily as needed (heartburn).    [provider]  calcium carbonate (TUMS - DOSED IN MG ELEMENTAL CALCIUM) 500 MG chewable tablet Chew 1 tablet by mouth daily as needed for indigestion or heartburn.    [provider]  diclofenac (VOLTAREN) 75 MG EC tablet Take 75 mg by mouth 2 (two) times daily.    [provider]  doxycycline (VIBRAMYCIN) 100 MG capsule Take 1 capsule (100 mg total) by mouth 2 (two) times daily. 05/14/19   Tacy Learn, PA-C  fluconazole (DIFLUCAN) 150 MG tablet Take 1 tablet (150 mg total) by mouth daily. Take one tablet today.  May repeat in 3 days. 02/07/20   Sharion Balloon, NP  glipiZIDE (GLUCOTROL) 5 MG tablet Take 5 mg by mouth 2 (two) times daily before a meal.    [provider]  metFORMIN (GLUCOPHAGE) 1000 MG tablet Take 1,000 mg by mouth 2 (two) times daily with a meal.    [provider]  methocarbamol (ROBAXIN) 500 MG tablet Take 1 tablet (500 mg total) by mouth 2 (two) times daily. 08/27/20   Caccavale, Sophia, PA-C  metroNIDAZOLE (FLAGYL) 500 MG tablet Take 1 tablet (500 mg total) by mouth 2 (two) times daily. 02/08/20   Lamptey, Myrene Galas, MD  miconazole (MONISTAT  7) 2 % vaginal cream 1 applicatorful qhs [id number] after finishing the diflucan 01/02/19   Aletha Halim, MD  naproxen (NAPROSYN) 500 MG tablet Take 1 tablet (500 mg total) by mouth 2 (two) times daily with a meal. 08/27/20   Caccavale, Sophia, PA-C  neomycin-polymyxin-hydrocortisone (CORTISPORIN) OTIC solution Place 3 drops into the right ear 3 (three) times daily. Patient not taking: Reported on 01/02/2019 11/02/18   Robyn Haber, MD  nystatin cream (MYCOSTATIN) Apply to affected area 2 times daily 02/07/20   Sharion Balloon, NP  omeprazole (PRILOSEC) 40 MG capsule Take 40  mg by mouth daily.    [provider]  ondansetron (ZOFRAN ODT) 4 MG disintegrating tablet Take 1 tablet (4 mg total) by mouth every 8 (eight) hours as needed for nausea or vomiting. Patient not taking: Reported on 01/02/2019 10/29/17   Ocie Cornfield T, PA-C  buPROPion [facility] SR) 150 MG 12 hr tablet Take 1 tablet (150 mg total) by mouth 2 (two) times daily. Patient not taking: Reported on 09/17/2014 07/01/13 11/02/18  Lorayne Marek, MD  famotidine (PEPCID) 20 MG tablet Take 1 tablet (20 mg total) by mouth 2 (two) times daily. Patient not taking: Reported on 05/12/2015 09/17/14 11/02/18  Clayton Bibles, PA-C      Allergies    Patient has no known allergies.    Review of Systems   Review of Systems  All other systems reviewed and are negative.  Physical Exam Updated Vital Signs There were no vitals taken for this visit. Physical Exam Vitals and nursing note reviewed.  Constitutional:      General: She is not in acute distress.    Appearance: Normal appearance. She is well-developed.  HENT:     Head: Normocephalic and atraumatic.  Eyes:     Conjunctiva/sclera: Conjunctivae normal.     Pupils: Pupils are equal, round, and reactive to light.  Cardiovascular:     Rate and Rhythm: Normal rate and regular rhythm.     Heart sounds: Normal heart sounds.  Pulmonary:     Effort: Pulmonary effort is normal. No respiratory distress.     Breath sounds: Normal breath sounds.  Abdominal:     General: There is no distension.     Palpations: Abdomen is soft.     Tenderness: There is no abdominal tenderness.  Musculoskeletal:        General: No deformity. Normal range of motion.     Cervical back: Normal range of motion and neck supple.  Skin:    General: Skin is warm and dry.  Neurological:     General: No focal deficit present.     Mental Status: She is alert and oriented to person, place, and time. Mental status is at baseline.     Cranial Nerves: No cranial nerve deficit.      Motor: No weakness.     Coordination: Coordination normal.     Gait: Gait normal.    ED Results / Procedures / Treatments   Labs (all labs ordered are listed, but only abnormal results are displayed) Labs Reviewed  COMPREHENSIVE METABOLIC PANEL - Abnormal; Notable for the following components:      Result Value   Sodium 134 (*)    Glucose, Bld 376 (*)    Alkaline Phosphatase 127 (*)    All other components within normal limits  I-STAT CHEM 8, ED - Abnormal; Notable for the following components:   Sodium 133 (*)    Creatinine, Ser 0.30 (*)    Glucose,  Bld 387 (*)    Calcium, Ion 1.07 (*)    All other components within normal limits  CBC WITH DIFFERENTIAL/PLATELET  PROTIME-INR  I-STAT BETA HCG BLOOD, ED ([facility], [facility], [facility] ONLY)  TROPONIN I (HIGH SENSITIVITY)    EKG None  Radiology No results found.  Procedures Procedures    Medications Ordered in ED Medications - No data to display  ED Course/ Medical Decision Making/ A&P Clinical Course as of 08/11/21 1440  Fri Aug 11, 2021  1138 CT Cervical Spine Wo Contrast [PM]    Clinical Course User Index [PM] Valarie Merino, MD                           Medical Decision Making Amount and/or Complexity of Data Reviewed Labs: ordered. Radiology: ordered. Decision-making details documented in ED Course.  Risk Prescription drug management.    Medical Screen Complete  This patient presented to the ED with complaint of possible syncope, left arm paresthesia.  This complaint involves an extensive number of treatment options. The initial differential diagnosis includes, but is not limited to, radiculopathy, intracranial pathology, metabolic abnormality, etc.  This presentation is: Acute, Self-Limited, Previously Undiagnosed, Uncertain Prognosis, Complicated, Systemic Symptoms, and Threat to Life/Bodily Function  Patient is presenting with 2 complaints.  Primary complaint is of left arm numbness and tingling.  This is been  ongoing for at least 7 days.  Patient also complains of intermittent dizziness that occurred yesterday.  The dizziness was intermittent throughout the day but there were 2 episodes that caused her to have reported brief syncopal events.  Patient without associated chest pain or shortness of breath.  Work-up today is without evidence of acute cardiac ischemia.  EKG is without abnormality.  Troponin is 4.  Screening labs obtained are without significant abnormality.  Notably patient's glucose is elevated in the 370s.  However anion gap is 11.  Imaging of the head and C-spine obtained with both CT and MRI.  Patient's C-spine with multilevel degenerative disease.  This is most likely cause of patient's apparent radiculopathy in the left arm.  Patient without loss of muscle strength in the left arm.  Patient is appropriate for discharge home.  Patient understands need for close follow-up with both her PCP and also with neurology.   Additional history obtained:  Additional history obtained from [facility] External records from outside sources obtained and reviewed including prior ED visits and prior Inpatient records.    Lab Tests:  I ordered and personally interpreted labs.  The pertinent results include: CBC, CMP, hCG, troponin, INR   Imaging Studies ordered:  I ordered imaging studies including CT head, CT C-spine, MRI brain, MRI C-spine I independently visualized and interpreted obtained imaging which showed multifocal degenerative changes in the C-spine I agree with the radiologist interpretation.   Cardiac Monitoring:  The patient was maintained on a cardiac monitor.  I personally viewed and interpreted the cardiac monitor which showed an underlying rhythm of: NSR   Medicines ordered:  I ordered medication including Ativan for anxiety Reevaluation of the patient after these medicines showed that the patient: improved    Problem List / ED Course:  Left upper extremity  paresthesia, dizziness, reported syncope   Reevaluation:  After the interventions noted above, I reevaluated the patient and found that they have: improved  Disposition:  After consideration of the diagnostic results and the patients response to treatment, I feel that the patent would benefit from close  outpatient follow-up.          Final Clinical Impression(s) / ED Diagnoses Final diagnoses:  Paresthesia    Rx / DC Orders ED Discharge Orders     None         Valarie Merino, MD 08/11/21 1525

## 2022-02-22 ENCOUNTER — Emergency Department (HOSPITAL_COMMUNITY)
Admission: EM | Admit: 2022-02-22 | Discharge: 2022-02-22 | Disposition: A | Discharge status: Home / Self Care | Payer: Self-pay | Attending: Emergency Medicine | Admitting: Emergency Medicine

## 2022-02-22 ENCOUNTER — Other Ambulatory Visit: Payer: Self-pay

## 2022-02-22 DIAGNOSIS — Z7984 Long term (current) use of oral hypoglycemic drugs: Secondary | ICD-10-CM | POA: Insufficient documentation

## 2022-02-22 DIAGNOSIS — E1165 Type 2 diabetes mellitus with hyperglycemia: Secondary | ICD-10-CM | POA: Insufficient documentation

## 2022-02-22 DIAGNOSIS — E878 Other disorders of electrolyte and fluid balance, not elsewhere classified: Secondary | ICD-10-CM | POA: Insufficient documentation

## 2022-02-22 DIAGNOSIS — N611 Abscess of the breast and nipple: Secondary | ICD-10-CM | POA: Insufficient documentation

## 2022-02-22 DIAGNOSIS — R739 Hyperglycemia, unspecified: Secondary | ICD-10-CM

## 2022-02-22 LAB — CBC WITH DIFFERENTIAL/PLATELET
Abs Immature Granulocytes: 0.02 10*3/uL (ref 0.00–0.07)
Basophils Absolute: 0.1 10*3/uL (ref 0.0–0.1)
Basophils Relative: 1 %
Eosinophils Absolute: 0.5 10*3/uL (ref 0.0–0.5)
Eosinophils Relative: 7 %
HCT: 40 % (ref 36.0–46.0)
Hemoglobin: 13.1 g/dL (ref 12.0–15.0)
Immature Granulocytes: 0 %
Lymphocytes Relative: 26 %
Lymphs Abs: 1.9 10*3/uL (ref 0.7–4.0)
MCH: 28.1 pg (ref 26.0–34.0)
MCHC: 32.8 g/dL (ref 30.0–36.0)
MCV: 85.8 fL (ref 80.0–100.0)
Monocytes Absolute: 0.4 10*3/uL (ref 0.1–1.0)
Monocytes Relative: 5 %
Neutro Abs: 4.3 10*3/uL (ref 1.7–7.7)
Neutrophils Relative %: 61 %
Platelets: 338 10*3/uL (ref 150–400)
RBC: 4.66 MIL/uL (ref 3.87–5.11)
RDW: 13.1 % (ref 11.5–15.5)
WBC: 7.2 10*3/uL (ref 4.0–10.5)
nRBC: 0 % (ref 0.0–0.2)

## 2022-02-22 LAB — I-STAT VENOUS BLOOD GAS, ED
Acid-base deficit: 2 mmol/L (ref 0.0–2.0)
Bicarbonate: 22.6 mmol/L (ref 20.0–28.0)
Calcium, Ion: 1.2 mmol/L (ref 1.15–1.40)
HCT: 41 % (ref 36.0–46.0)
Hemoglobin: 13.9 g/dL (ref 12.0–15.0)
O2 Saturation: 98 %
Potassium: 3.7 mmol/L (ref 3.5–5.1)
Sodium: 134 mmol/L — ABNORMAL LOW (ref 135–145)
TCO2: 24 mmol/L (ref 22–32)
pCO2, Ven: 37.1 mmHg — ABNORMAL LOW (ref 44–60)
pH, Ven: 7.392 (ref 7.25–7.43)
pO2, Ven: 101 mmHg — ABNORMAL HIGH (ref 32–45)

## 2022-02-22 LAB — BASIC METABOLIC PANEL
Anion gap: 16 — ABNORMAL HIGH (ref 5–15)
BUN: 6 mg/dL (ref 6–20)
CO2: 20 mmol/L — ABNORMAL LOW (ref 22–32)
Calcium: 9 mg/dL (ref 8.9–10.3)
Chloride: 99 mmol/L (ref 98–111)
Creatinine, Ser: 0.52 mg/dL (ref 0.44–1.00)
GFR, Estimated: >60 mL/min (ref >60)
Glucose, Bld: 441 mg/dL — ABNORMAL HIGH (ref 70–99)
Potassium: 3.8 mmol/L (ref 3.5–5.1)
Sodium: 135 mmol/L (ref 135–145)

## 2022-02-22 LAB — CBG MONITORING, ED
Glucose-Capillary: 397 mg/dL — ABNORMAL HIGH (ref 70–99)
Glucose-Capillary: 292 mg/dL — ABNORMAL HIGH (ref 70–99)

## 2022-02-22 MED ORDER — ACETAMINOPHEN 500 MG PO TABS
1000.0000 mg | ORAL_TABLET | Freq: Once | ORAL | Status: AC
  Administered 2022-02-22: 1000 mg via ORAL
  Filled 2022-02-22: qty 2

## 2022-02-22 MED ORDER — INSULIN ASPART 100 UNIT/ML IJ SOLN
6.0000 [IU] | Freq: Once | INTRAMUSCULAR | Status: AC
  Administered 2022-02-22: 6 [IU] via SUBCUTANEOUS

## 2022-02-22 MED ORDER — CEPHALEXIN 500 MG PO CAPS
500.0000 mg | ORAL_CAPSULE | Freq: Four times a day (QID) | ORAL | 0 refills | Status: DC
Start: 2022-02-22 — End: 2022-04-19

## 2022-02-22 MED ORDER — SODIUM CHLORIDE 0.9 % IV BOLUS
1000.0000 mL | Freq: Once | INTRAVENOUS | Status: AC
  Administered 2022-02-22: 1000 mL via INTRAVENOUS

## 2022-02-22 NOTE — ED Triage Notes (Signed)
Pt. Stated, I had a boil on my rt. Breast and it popped last night and my Dr. Catalina Pizza me to come here. Ive been taking Ibuprofen and an antibiotic for 10 days.

## 2022-02-22 NOTE — ED Provider Triage Note (Signed)
Emergency Medicine Provider Triage Evaluation Note  Lynn Vazquez , a 46 y.o. female  was evaluated in triage.  Pt complains of abscess to the right breast.  She reports she noted a bump on the right breast about 2 weeks ago, she tried to squeeze it but then it got increasingly inflamed and painful with surrounding redness.  Her primary care doctor prescribed her an antibiotic, she is unsure of the name but describes it as a green capsule she takes 4 times a day so I suspect Keflex.  She has been on this medication for 4 days.  She reports at 2 AM last night the abscess ruptured and drained a large amount of clots but she is still having pain with increasing redness and her primary care doctor recommended that she come in to be evaluated.  She does report history of diabetes..  Review of Systems  Positive: Abscess, breast pain Negative: Fevers, chills, vomiting, chest pain  Physical Exam  BP (!) 167/93 (BP Location: Right Arm)   Pulse 85   Temp 98.4 F (36.9 C)   Resp 16   Ht 5\' 4"  (1.626 m)   Wt 96.2 kg   LMP 02/15/2022   SpO2 98%   BMI 36.39 kg/m  Gen:   Awake, no distress   Resp:  Normal effort  MSK:   Moves extremities without difficulty  Other:  Right breast with approximately 5 cm circular area of erythema with 2 openings without current drainage, the area is indurated but without palpable fluctuance or expressible drainage.  Medical Decision Making  Medically screening exam initiated at 11:03 AM.  Appropriate orders placed.  Lynn Vazquez was informed that the remainder of the evaluation will be completed by another provider, this initial triage assessment does not replace that evaluation, and the importance of remaining in the ED until their evaluation is complete.  Fortunately abscess appears to have already drained on its own, and patient is without abnormal vitals to suspect just sepsis, redness is well localized to the area surrounding the  abscess and does not extend down the chest wall.  Given underlying history of diabetes will check basic lab work and blood sugar.  May need to change antibiotics.   Lynn Vazquez, Lynn Vazquez 02/22/22 1115

## 2022-02-22 NOTE — Discharge Instructions (Addendum)
You have signs of a breast abscess.  This area has partially drained.  Please continue the antibiotics and call the breast center to be seen.  Blood sugar was also elevated and you were given fluids and some insulin.  It will be important for you to have better control of your diabetes to allow for wound healing.  Please follow this up with your primary care doctor.

## 2022-02-22 NOTE — ED Provider Notes (Signed)
[facility] EMERGENCY DEPARTMENT Provider Note   CSN: [id number] Arrival date & time: 02/22/22  1044     History  Chief Complaint  Patient presents with   Abscess   Breast Pain    Lynn Vazquez is a 46 y.o. female.  She has had a sore on her right breast that is been going on for few weeks.  Her primary care doctor had her on Keflex.  They were told if the breast leaked any fluid she should come to the emergency department.  She had fevers initially none recently.  The breast drained last night with some cloudy white fluid.  The history is provided by the patient. The history is limited by a language barrier. A language interpreter was used (family at patient request).  Abscess Location:  Torso Torso abscess location:  R breast Size:  5 Abscess quality: fluctuance, induration, painful and redness   Red streaking: no   Duration:  2 weeks Progression:  Improving Pain details:    Severity:  Moderate   Timing:  Constant   Progression:  Unchanged Context: diabetes   Worsened by:  Nothing Ineffective treatments:  Oral antibiotics Associated symptoms: fever   Associated symptoms: no headaches, no nausea and no vomiting        Home Medications Prior to Admission medications   Medication Sig Start Date End Date Taking? Authorizing Provider  acetaminophen (TYLENOL) 325 MG tablet Take 650 mg by mouth every 6 (six) hours as needed for mild pain, moderate pain or headache.    [provider]  acetaZOLAMIDE (DIAMOX) 125 MG tablet Take 2 tablets (250 mg total) by mouth 2 (two) times daily. 11/02/18 12/02/18  Robyn Haber, MD  Ca Carbonate-Mag Hydroxide (ROLAIDS) 550-110 MG CHEW Chew 1 tablet by mouth daily as needed (heartburn).    [provider]  calcium carbonate (TUMS - DOSED IN MG ELEMENTAL CALCIUM) 500 MG chewable tablet Chew 1 tablet by mouth daily as needed for indigestion or heartburn.    [provider]   diclofenac (VOLTAREN) 75 MG EC tablet Take 75 mg by mouth 2 (two) times daily.    [provider]  doxycycline (VIBRAMYCIN) 100 MG capsule Take 1 capsule (100 mg total) by mouth 2 (two) times daily. 05/14/19   Tacy Learn, PA-C  fluconazole (DIFLUCAN) 150 MG tablet Take 1 tablet (150 mg total) by mouth daily. Take one tablet today.  May repeat in 3 days. 02/07/20   Sharion Balloon, NP  glipiZIDE (GLUCOTROL) 5 MG tablet Take 5 mg by mouth 2 (two) times daily before a meal.    [provider]  metFORMIN (GLUCOPHAGE) 1000 MG tablet Take 1,000 mg by mouth 2 (two) times daily with a meal.    [provider]  methocarbamol (ROBAXIN) 500 MG tablet Take 1 tablet (500 mg total) by mouth 2 (two) times daily. 08/27/20   Caccavale, Sophia, PA-C  metroNIDAZOLE (FLAGYL) 500 MG tablet Take 1 tablet (500 mg total) by mouth 2 (two) times daily. 02/08/20   Chase Picket, MD  miconazole (MONISTAT 7) 2 % vaginal cream 1 applicatorful qhs [id number] after finishing the diflucan 01/02/19   Aletha Halim, MD  naproxen (NAPROSYN) 500 MG tablet Take 1 tablet (500 mg total) by mouth 2 (two) times daily with a meal. 08/27/20   Caccavale, Sophia, PA-C  neomycin-polymyxin-hydrocortisone (CORTISPORIN) OTIC solution Place 3 drops into the right ear 3 (three) times daily. Patient not taking: Reported on 01/02/2019 11/02/18  Robyn Haber, MD  nystatin cream (MYCOSTATIN) Apply to affected area 2 times daily 02/07/20   Sharion Balloon, NP  omeprazole (PRILOSEC) 40 MG capsule Take 40 mg by mouth daily.    [provider]  ondansetron (ZOFRAN ODT) 4 MG disintegrating tablet Take 1 tablet (4 mg total) by mouth every 8 (eight) hours as needed for nausea or vomiting. Patient not taking: Reported on 01/02/2019 10/29/17   Ocie Cornfield T, PA-C  buPROPion [facility] SR) 150 MG 12 hr tablet Take 1 tablet (150 mg total) by mouth 2 (two) times daily. Patient not taking: Reported on 09/17/2014 07/01/13  11/02/18  Lorayne Marek, MD  famotidine (PEPCID) 20 MG tablet Take 1 tablet (20 mg total) by mouth 2 (two) times daily. Patient not taking: Reported on 05/12/2015 09/17/14 11/02/18  Clayton Bibles, PA-C      Allergies    Patient has no known allergies.    Review of Systems   Review of Systems  Constitutional:  Positive for fever.  Respiratory:  Negative for shortness of breath.   Cardiovascular:  Negative for chest pain.  Gastrointestinal:  Negative for nausea and vomiting.  Skin:  Positive for wound.  Neurological:  Negative for headaches.    Physical Exam Updated Vital Signs BP (!) 145/90 (BP Location: Right Arm)   Pulse 66   Temp 98.2 F (36.8 C) (Oral)   Resp 15   Ht 5\' 4"  (1.626 m)   Wt 96.2 kg   LMP 02/15/2022   SpO2 98%   BMI 36.39 kg/m  Physical Exam Vitals and nursing note reviewed.  Constitutional:      General: She is not in acute distress.    Appearance: Normal appearance. She is well-developed.  HENT:     Head: Normocephalic and atraumatic.  Eyes:     Conjunctiva/sclera: Conjunctivae normal.  Cardiovascular:     Rate and Rhythm: Normal rate and regular rhythm.     Heart sounds: No murmur heard. Pulmonary:     Effort: Pulmonary effort is normal. No respiratory distress.     Breath sounds: Normal breath sounds.  Chest:    Abdominal:     Palpations: Abdomen is soft.     Tenderness: There is no abdominal tenderness. There is no guarding or rebound.  Musculoskeletal:        General: No swelling.     Cervical back: Neck supple.  Skin:    General: Skin is warm and dry.     Capillary Refill: Capillary refill takes less than 2 seconds.  Neurological:     General: No focal deficit present.     Mental Status: She is alert.     ED Results / Procedures / Treatments   Labs (all labs ordered are listed, but only abnormal results are displayed) Labs Reviewed  BASIC METABOLIC PANEL - Abnormal; Notable for the following components:      Result Value   CO2 20  (*)    Glucose, Bld 441 (*)    Anion gap 16 (*)    All other components within normal limits  I-STAT VENOUS BLOOD GAS, ED - Abnormal; Notable for the following components:   pCO2, Ven 37.1 (*)    pO2, Ven 101 (*)    Sodium 134 (*)    All other components within normal limits  CBC WITH DIFFERENTIAL/PLATELET  CBG MONITORING, ED    EKG None  Radiology No results found.  Procedures Procedures    Medications Ordered in ED Medications  acetaminophen (TYLENOL)  tablet 1,000 mg (1,000 mg Oral Given 02/22/22 1623)  sodium chloride 0.9 % bolus 1,000 mL (0 mLs Intravenous Stopped 02/22/22 2145)  insulin aspart (novoLOG) injection 6 Units (6 Units Subcutaneous Given 02/22/22 1900)    ED Course/ Medical Decision Making/ A&P                           Medical Decision Making Risk Prescription drug management.   This patient complains of draining breast abscess; this involves an extensive number of treatment Options and is a complaint that carries with it a high risk of complications and morbidity. The differential includes cellulitis, abscess, tumor, hyperglycemia, DKA  I ordered, reviewed and interpreted labs, which included CBC with normal white count normal hemoglobin, chemistries with mildly low bicarb elevated glucose elevated anion gap, VBG with normal pH I ordered medication IV fluids and insulin, oral Tylenol and reviewed PMP when indicated. Additional history obtained from patient's daughter Previous records obtained and reviewed in epic including PCP notes  Social determinants considered, no significant barriers Critical Interventions: None  After the interventions stated above, I reevaluated the patient and found patient to be nontoxic-appearing no distress Admission and further testing considered, she would benefit from referral to breast center where they can more fully evaluate and treat.  We will have her continue antibiotics and refill prescription for Keflex.   Blood sugar improved on reevaluation.  Return instructions discussed         Final Clinical Impression(s) / ED Diagnoses Final diagnoses:  Breast abscess  Hyperglycemia    Rx / DC Orders ED Discharge Orders          Ordered    cephALEXin (KEFLEX) 500 MG capsule  4 times daily        02/22/22 2142              Terrilee Files, MD 02/23/22 1048

## 2022-04-19 ENCOUNTER — Ambulatory Visit: Payer: Self-pay | Admitting: Hematology and Oncology

## 2022-04-19 VITALS — BP 150/94 | Wt 207.0 lb

## 2022-04-19 DIAGNOSIS — Z01419 Encounter for gynecological examination (general) (routine) without abnormal findings: Secondary | ICD-10-CM

## 2022-04-19 MED ORDER — CEPHALEXIN 500 MG PO CAPS: 500.0000 mg | ORAL_CAPSULE | Freq: Two times a day (BID) | ORAL | 0 refills | Status: DC

## 2022-04-19 NOTE — Progress Notes (Signed)
Lynn Vazquez is a 47 y.o. G5P0 female who presents to [facility] clinic today with complaint of right breast pain and redness.    Pap Smear: Pap smear completed today. Last Pap smear was 2010 and was normal. Per patient has no history of an abnormal Pap smear. Last Pap smear result is available in Epic.   Physical exam: Breasts Breasts symmetrical. No skin abnormalities bilateral breasts. No nipple retraction bilateral breasts. No nipple discharge bilateral breasts. No lymphadenopathy. No lumps palpated bilateral breasts. Redness with pain noted above the right nipple at 10-12 o'clock.    Pelvic/Bimanual Ext Genitalia No lesions, no swelling and no discharge observed on external genitalia.        Vagina Vagina pink and normal texture. No lesions or discharge observed in vagina.        Cervix Cervix is present. Cervix pink and of normal texture. No discharge observed.    Uterus Uterus is present and palpable. Uterus in normal position and normal size.        Adnexae Bilateral ovaries present and palpable. No tenderness on palpation.         Rectovaginal No rectal exam completed today since patient had no rectal complaints. No skin abnormalities observed on exam.     Smoking History: Patient has never smoked and was not referred to quit line.    Patient Navigation: Patient education provided. Access to services provided for patient through [place] interpreter provided. No transportation provided   Colorectal Cancer Screening: Per patient has never had colonoscopy completed No complaints today.    Breast and Cervical Cancer Risk Assessment: Patient does not have family history of breast cancer, known genetic mutations, or radiation treatment to the chest before age 50. Patient does not have history of cervical dysplasia, immunocompromised, or DES exposure in-utero.  Risk Scores as of 04/19/2022     [place]           5-year 0.55 %    Lifetime 7.07 %   This patient is [place] but has no documented birth country, so the [place] used data from [place] patients to calculate their risk score. Document a birth country in the Demographics activity for a more accurate score.         Last calculated by Claretha Cooper, CMA on 04/19/2022 at 10:45 AM        A: [facility]CP exam with pap smear Complaint of right breast pain with redness. Breast ultrasound on 04/13/22 indicates new infection versus granulamtous mastitis. No concern for malignancy. We will initiate antibiotics today, Keflex twice daily for 7 days.   P: Will plan for one week of Keflex.  Melodye Ped, NP 04/19/2022 11:26 AM

## 2022-04-19 NOTE — Patient Instructions (Signed)
Discussed recent ultrasound results and clinical exam indicating infection. We will plan for one week of Keflex and she will call if no improvement.

## 2022-04-23 LAB — CYTOLOGY - PAP
Adequacy: ABSENT
Comment: NEGATIVE
Diagnosis: NEGATIVE
High risk HPV: NEGATIVE

## 2022-07-13 ENCOUNTER — Other Ambulatory Visit: Payer: Self-pay

## 2022-07-13 ENCOUNTER — Emergency Department (HOSPITAL_COMMUNITY)
Admission: EM | Admit: 2022-07-13 | Discharge: 2022-07-13 | Disposition: A | Discharge status: Home / Self Care | Payer: Self-pay | Attending: Emergency Medicine | Admitting: Emergency Medicine

## 2022-07-13 DIAGNOSIS — Z7984 Long term (current) use of oral hypoglycemic drugs: Secondary | ICD-10-CM | POA: Insufficient documentation

## 2022-07-13 DIAGNOSIS — Z794 Long term (current) use of insulin: Secondary | ICD-10-CM | POA: Insufficient documentation

## 2022-07-13 DIAGNOSIS — M5441 Lumbago with sciatica, right side: Secondary | ICD-10-CM | POA: Insufficient documentation

## 2022-07-13 DIAGNOSIS — E119 Type 2 diabetes mellitus without complications: Secondary | ICD-10-CM | POA: Insufficient documentation

## 2022-07-13 MED ORDER — LIDOCAINE 5 % EX PTCH
1.0000 | MEDICATED_PATCH | CUTANEOUS | Status: DC
  Administered 2022-07-13: 1 via TRANSDERMAL
  Filled 2022-07-13: qty 1

## 2022-07-13 MED ORDER — HYDROCODONE-ACETAMINOPHEN 5-325 MG PO TABS
1.0000 | ORAL_TABLET | Freq: Once | ORAL | Status: AC
  Administered 2022-07-13: 1 via ORAL
  Filled 2022-07-13: qty 1

## 2022-07-13 MED ORDER — PREDNISONE 10 MG PO TABS: 40.0000 mg | ORAL_TABLET | Freq: Every day | ORAL | 0 refills | Status: AC

## 2022-07-13 MED ORDER — PREDNISONE 20 MG PO TABS
60.0000 mg | ORAL_TABLET | Freq: Once | ORAL | Status: AC
  Administered 2022-07-13: 60 mg via ORAL
  Filled 2022-07-13: qty 3

## 2022-07-13 NOTE — ED Notes (Signed)
Meds give, lidocaine patch placed on lower right back.

## 2022-07-13 NOTE — ED Notes (Signed)
Sciatic pain, 10/10 per pt daughter. Onset today.

## 2022-07-13 NOTE — ED Triage Notes (Addendum)
Patient coming to ED for evaluation of R leg pain.  Reports hx of sciatica.  States pain became too severe today and is unable to bear weight on R leg.  No reports of weakness.  Pt took Ibuprofen at 1700 without improvement

## 2022-07-13 NOTE — ED Provider Notes (Signed)
[place] [facility] AT [facility] Provider Note   CSN: [id number] Arrival date & time: 07/13/22  1942     History  Chief Complaint  Patient presents with   Leg Pain    Lynn Vazquez is a 47 y.o. female.  With a history of insulin-dependent type 2 diabetes who presents to the ED for evaluation of right-sided low back pain.  This began approximately 1 week ago.  Denies recent falls, injuries or other sources of trauma.  States the pain has progressively gotten worse.  The pain does radiate down the back of the leg patient to the foot.  She denies numbness, weakness or tingling.  Denies saddle paresthesias, fevers, chills, urinary or bowel incontinence, history of injection drug use.  She has been using Tylenol and ibuprofen with minimal relief.  Has a history of similar approximately 2 years ago.  Her symptoms are worse with movements and improved with rest.  History was provided by the patient with use of a Spanish interpreter   Leg Pain Associated symptoms: back pain        Home Medications Prior to Admission medications   Medication Sig Start Date End Date Taking? Authorizing Provider  predniSONE (DELTASONE) 10 MG tablet Take 4 tablets (40 mg total) by mouth daily with breakfast. 07/13/22  Yes Maddilyn Campus, Edsel Petrin, PA-C  acetaminophen (TYLENOL) 325 MG tablet Take 650 mg by mouth every 6 (six) hours as needed for mild pain, moderate pain or headache.    [provider]  acetaZOLAMIDE (DIAMOX) 125 MG tablet Take 2 tablets (250 mg total) by mouth 2 (two) times daily. 11/02/18 12/02/18  Elvina Sidle, MD  calcium carbonate (OSCAL) 1500 (600 Ca) MG TABS tablet Take by mouth 2 (two) times daily with a meal.    [provider]  cephALEXin (KEFLEX) 500 MG capsule Take 1 capsule (500 mg total) by mouth 2 (two) times daily. 04/19/22   Ilda Basset A, NP  diclofenac (VOLTAREN) 75 MG EC tablet Take 75 mg by mouth 2 (two) times daily.     [provider]  glipiZIDE (GLUCOTROL) 10 MG tablet glipizide 10 mg tablet  Take 1 tablet twice a day by oral route.    [provider]  hydrOXYzine (ATARAX) 25 MG tablet hydroxyzine HCl 25 mg tablet  TAKE ONE TABLET BY MOUTH AT BEDTIME    [provider]  insulin NPH Human (NOVOLIN N) 100 UNIT/ML injection Novolin N NPH U-100 Insulin isophane 100 unit/mL subcutaneous susp  Inject 42 units every day by subcutaneous route in the evening.    [provider]  lisinopril (ZESTRIL) 10 MG tablet lisinopril 10 mg tablet  Take 1 tablet twice a day by oral route.    [provider]  metFORMIN (GLUCOPHAGE) 1000 MG tablet Take 1,000 mg by mouth 2 (two) times daily with a meal.    [provider]  methocarbamol (ROBAXIN) 500 MG tablet Take 1 tablet (500 mg total) by mouth 2 (two) times daily. 08/27/20   Caccavale, Sophia, PA-C  naproxen (NAPROSYN) 500 MG tablet Take 1 tablet (500 mg total) by mouth 2 (two) times daily with a meal. 08/27/20   Caccavale, Sophia, PA-C  Omega-3 Fatty Acids (OMEGA 3 500 PO) Omega 3  One capsule by oral route twice daily    [provider]  buPROPion (WELLBUTRIN SR) 150 MG 12 hr tablet Take 1 tablet (150 mg total) by mouth 2 (two) times daily. Patient not taking: Reported on 09/17/2014  07/01/13 11/02/18  Doris Cheadle, MD  famotidine (PEPCID) 20 MG tablet Take 1 tablet (20 mg total) by mouth 2 (two) times daily. Patient not taking: Reported on 05/12/2015 09/17/14 11/02/18  Trixie Dredge, PA-C      Allergies    Patient has no known allergies.    Review of Systems   Review of Systems  Musculoskeletal:  Positive for back pain.  All other systems reviewed and are negative.   Physical Exam Updated Vital Signs BP (!) 140/108 (BP Location: Left Arm)   Pulse 77   Temp 98.1 F (36.7 C) (Oral)   Resp 18   Ht 5\' 4"  (1.626 m)   Wt 95.7 kg   SpO2 100%   BMI 36.22 kg/m  Physical Exam Vitals and nursing note reviewed.   Constitutional:      General: She is not in acute distress.    Appearance: Normal appearance. She is normal weight. She is not ill-appearing.     Comments: Appears uncomfortable in chair  HENT:     Head: Normocephalic and atraumatic.  Pulmonary:     Effort: Pulmonary effort is normal. No respiratory distress.  Abdominal:     General: Abdomen is flat.  Musculoskeletal:        General: Normal range of motion.     Cervical back: Neck supple.     Comments: Straight leg raise negative on the left.  Positive on the right.  Sensation intact in bilateral lower extremities.  No midline C, T or L-spine TTP.  There is mild TTP to the right lower back musculature.  There are no step-offs, deformities or crepitus.  Skin:    General: Skin is warm and dry.  Neurological:     Mental Status: She is alert and oriented to person, place, and time.  Psychiatric:        Mood and Affect: Mood normal.        Behavior: Behavior normal.     ED Results / Procedures / Treatments   Labs (all labs ordered are listed, but only abnormal results are displayed) Labs Reviewed - No data to display  EKG None  Radiology No results found.  Procedures Procedures    Medications Ordered in ED Medications  lidocaine (LIDODERM) 5 % 1 patch (1 patch Transdermal Patch Applied 07/13/22 2041)  HYDROcodone-acetaminophen (NORCO/VICODIN) 5-325 MG per tablet 1 tablet (1 tablet Oral Given 07/13/22 2041)  predniSONE (DELTASONE) tablet 60 mg (60 mg Oral Given 07/13/22 2041)    ED Course/ Medical Decision Making/ A&P                             Medical Decision Making Risk Prescription drug management.  This patient presents to the ED for concern of right-sided low back pain, this involves an extensive number of treatment options, and is a complaint that carries with it a high risk of complications and morbidity.  Emergent considerations in the differential diagnosis of back pain include:occult fracture, congenital  anomalies, tumors, vascular catastrophes, osteomyelitis of vertebrae, infections of disc, meninges or cord, space occupying lesions within canal leading to cord or root compression including epidural abscess.   Co morbidities that complicate the patient evaluation  IDDM2  My initial workup includes symptom control  Additional history obtained from: Nursing notes from this visit.  Afebrile, hemodynamically stable.  47 year old female presents ED for evaluation of right lower back pain with radiation down the back of the right leg.  Straight leg raise positive on the right.  No red flag symptoms to indicate cord compression.  Her history and presentation correlates with acute flare of sciatica.  Patient is an insulin-dependent diabetic and uses sliding scale insulin.  Patient was offered a steroid and educated on the side effects including hyperglycemia.  She accepts this and states that she will monitor her blood sugar more closely and adjust her insulin appropriately.  She was encouraged to alternate Tylenol and ibuprofen at home for pain.  She was given information regarding sciatica rehab.  She was encouraged to follow-up with her primary care provider early next week for reevaluation.  She was given return precautions.  Stable at discharge.  At this time there does not appear to be any evidence of an acute emergency medical condition and the patient appears stable for discharge with appropriate outpatient follow up. Diagnosis was discussed with patient who verbalizes understanding of care plan and is agreeable to discharge. I have discussed return precautions with patient who verbalizes understanding. Patient encouraged to follow-up with their PCP within 1 week. All questions answered.  Note: Portions of this report may have been transcribed using voice recognition software. Every effort was made to ensure accuracy; however, inadvertent computerized transcription errors may still be  present.        Final Clinical Impression(s) / ED Diagnoses Final diagnoses:  Acute right-sided low back pain with right-sided sciatica    Rx / DC Orders ED Discharge Orders          Ordered    predniSONE (DELTASONE) 10 MG tablet  Daily with breakfast        07/13/22 2052              Michelle Piper, Cordelia Poche 07/13/22 2054    Derwood Kaplan, MD 07/18/22 [phone]

## 2022-07-13 NOTE — Discharge Instructions (Addendum)
Le han atendido hoy por su queja de dolor de espalda en el lado derecho. Sus medicamentos de alta incluyen Tylenol e ibuprofeno alternativos para Chief Technology Officer. Puede alternarlos cada 4 horas. Puede tomar hasta 800 mg de ibuprofeno a la vez y hasta 1000 mg de tylenol. Prednisona. Este es un esteroide. Tmelo segn lo prescrito y durante toda la prescripcin. Esto provoca un aumento del Production assistant, radio. Debe controlar su nivel de azcar en sangre y Dawayne Patricia su dosis de insulina de [place]. Las instrucciones de Theatre stage manager son las siguientes: Siga los ejercicios de rehabilitacin de la citica enumerados en este paquete. Haga un seguimiento con: su proveedor de atencin primaria en una semana Busque atencin mdica inmediata si desarrolla alguno de los siguientes sntomas:  No puede controlar cundo Geographical information systems officer o defecar (incontinencia).  Tiene lo siguiente: o Debilidad que empeora en la parte inferior de la espalda, la pelvis, las nalgas o las piernas. o Enrojecimiento o inflamacin en la espalda. o Sensacin de ardor al Beatrix Shipper. En este momento no parece haber una condicin [employer], sin embargo, siempre existe la posibilidad de que las condiciones Stanton. Lea y siga las instrucciones a continuacin.  No tome su medicamento si desarrolla sarpullido con picazn, hinchazn en la boca o los labios o dificultad para respirar; Llame al 911 y busque atencin mdica de emergencia inmediata si esto ocurre.  Puede revisar sus pruebas de laboratorio y [place] de imgenes en su totalidad en su cuenta MyChart. Analice todos los resultados de forma completa con su proveedor de atencin primaria y otro especialista en su visita de seguimiento.  Nota: Es posible que partes de este texto se hayan transcrito utilizando un software de Network engineer de voz. Se hizo todo lo posible para garantizar la precisin; sin embargo, es posible que an Nutritional therapist errores de transcripcin computarizados  involuntarios.  You have been seen today for your complaint of right-sided back pain. Your discharge medications include Alternate tylenol and ibuprofen for pain. You may alternate these every 4 hours. You may take up to 800 mg of ibuprofen at a time and up to 1000 mg of tylenol. Prednisone.  This is a steroid.  Take it as prescribed and for the entire duration of the prescription.  This causes an increase in blood sugar.  You should check your blood sugar and adjust your insulin dosing appropriately. Home care instructions are as follows:  Follow the sciatica rehab exercises listed in this packet Follow up with: your primary care provider in one week Please seek immediate medical care if you develop any of the following symptoms: No puede controlar cundo orinar o defecar (incontinencia). Tiene lo siguiente: Debilidad que [employer] parte inferior de la espalda, la pelvis, las nalgas o las piernas. Enrojecimiento o inflamacin en la espalda. Sensacin de ardor al [employer]. At this time there does not appear to be the presence of an emergent medical condition, however there is always the potential for conditions to change. Please read and follow the below instructions.  Do not take your medicine if  develop an itchy rash, swelling in your mouth or lips, or difficulty breathing; call 911 and seek immediate emergency medical attention if this occurs.  You may review your lab tests and imaging results in their entirety on your MyChart account.  Please discuss all results of fully with your primary care provider and other specialist at your follow-up visit.  Note: Portions of this text may have been transcribed using voice recognition software.  Every effort was made to ensure accuracy; however, inadvertent computerized transcription errors may still be present.

## 2023-11-28 ENCOUNTER — Other Ambulatory Visit: Payer: Self-pay

## 2023-11-28 ENCOUNTER — Emergency Department (HOSPITAL_COMMUNITY)
Admission: EM | Admit: 2023-11-28 | Discharge: 2023-11-29 | Disposition: A | Discharge status: Home / Self Care | Payer: Self-pay | Attending: Emergency Medicine | Admitting: Emergency Medicine

## 2023-11-28 DIAGNOSIS — R1084 Generalized abdominal pain: Secondary | ICD-10-CM | POA: Insufficient documentation

## 2023-11-28 DIAGNOSIS — Z7984 Long term (current) use of oral hypoglycemic drugs: Secondary | ICD-10-CM | POA: Insufficient documentation

## 2023-11-28 DIAGNOSIS — Z794 Long term (current) use of insulin: Secondary | ICD-10-CM | POA: Insufficient documentation

## 2023-11-28 DIAGNOSIS — N390 Urinary tract infection, site not specified: Secondary | ICD-10-CM | POA: Insufficient documentation

## 2023-11-28 DIAGNOSIS — E1165 Type 2 diabetes mellitus with hyperglycemia: Secondary | ICD-10-CM | POA: Insufficient documentation

## 2023-11-28 LAB — COMPREHENSIVE METABOLIC PANEL WITH GFR
ALT: 23 U/L (ref 0–44)
AST: 28 U/L (ref 15–41)
Albumin: 3.9 g/dL (ref 3.5–5.0)
Alkaline Phosphatase: 106 U/L (ref 38–126)
Anion gap: 16 — ABNORMAL HIGH (ref 5–15)
BUN: 18 mg/dL (ref 6–20)
CO2: 20 mmol/L — ABNORMAL LOW (ref 22–32)
Calcium: 9.5 mg/dL (ref 8.9–10.3)
Chloride: 98 mmol/L (ref 98–111)
Creatinine, Ser: 0.71 mg/dL (ref 0.44–1.00)
GFR, Estimated: >60 mL/min (ref >60)
Glucose, Bld: 349 mg/dL — ABNORMAL HIGH (ref 70–99)
Potassium: 4.5 mmol/L (ref 3.5–5.1)
Sodium: 134 mmol/L — ABNORMAL LOW (ref 135–145)
Total Bilirubin: 0.8 mg/dL (ref 0.0–1.2)
Total Protein: 7.3 g/dL (ref 6.5–8.1)

## 2023-11-28 LAB — URINALYSIS, ROUTINE W REFLEX MICROSCOPIC
Bilirubin Urine: NEGATIVE
Glucose, UA: >=500 mg/dL — AB
Hgb urine dipstick: NEGATIVE
Ketones, ur: NEGATIVE mg/dL
Nitrite: POSITIVE — AB
Protein, ur: 100 mg/dL — AB
Specific Gravity, Urine: 1.016 (ref 1.005–1.030)
WBC, UA: >50 WBC/hpf (ref 0–5)
pH: 5 (ref 5.0–8.0)

## 2023-11-28 LAB — CBC
HCT: 39.6 % (ref 36.0–46.0)
Hemoglobin: 13.4 g/dL (ref 12.0–15.0)
MCH: 29.5 pg (ref 26.0–34.0)
MCHC: 33.8 g/dL (ref 30.0–36.0)
MCV: 87.2 fL (ref 80.0–100.0)
Platelets: 281 K/uL (ref 150–400)
RBC: 4.54 MIL/uL (ref 3.87–5.11)
RDW: 13.3 % (ref 11.5–15.5)
WBC: 8.8 K/uL (ref 4.0–10.5)
nRBC: 0 % (ref 0.0–0.2)

## 2023-11-28 LAB — LIPASE, BLOOD: Lipase: 29 U/L (ref 11–51)

## 2023-11-28 LAB — HCG, SERUM, QUALITATIVE: Preg, Serum: NEGATIVE

## 2023-11-28 NOTE — ED Triage Notes (Signed)
 Pt reporting abdominal pain and headache starting last week. Pt reports 10/10 pain . Pt reporting upper abdominal pain and left flank pain . Pt denies nausea vomiting and diarrhea.

## 2023-11-28 NOTE — ED Notes (Signed)
 Patient unable to void at this time.  KM

## 2023-11-29 ENCOUNTER — Emergency Department (HOSPITAL_COMMUNITY): Payer: Self-pay

## 2023-11-29 MED ORDER — SODIUM CHLORIDE 0.9 % IV SOLN
1.0000 g | Freq: Once | INTRAVENOUS | Status: AC
  Administered 2023-11-29: 1 g via INTRAVENOUS
  Filled 2023-11-29: qty 10

## 2023-11-29 MED ORDER — CEPHALEXIN 500 MG PO CAPS: 500.0000 mg | ORAL_CAPSULE | Freq: Two times a day (BID) | ORAL | 0 refills | Status: DC

## 2023-11-29 MED ORDER — ONDANSETRON HCL 4 MG/2ML IJ SOLN
4.0000 mg | Freq: Once | INTRAMUSCULAR | Status: AC
  Administered 2023-11-29: 4 mg via INTRAVENOUS
  Filled 2023-11-29: qty 2

## 2023-11-29 MED ORDER — MORPHINE SULFATE (PF) 4 MG/ML IV SOLN
4.0000 mg | Freq: Once | INTRAVENOUS | Status: AC
  Administered 2023-11-29: 4 mg via INTRAVENOUS
  Filled 2023-11-29: qty 1

## 2023-11-29 MED ORDER — CEPHALEXIN 500 MG PO CAPS: 500.0000 mg | ORAL_CAPSULE | Freq: Two times a day (BID) | ORAL | 0 refills | Status: AC

## 2023-11-29 MED ORDER — SODIUM CHLORIDE 0.9 % IV BOLUS
1000.0000 mL | Freq: Once | INTRAVENOUS | Status: AC
  Administered 2023-11-29: 1000 mL via INTRAVENOUS

## 2023-11-29 NOTE — Discharge Instructions (Addendum)
 Tome los antibiticos segn lo prescrito y complete el tratamiento. Para programar una cita de seguimiento con el Dr. Selma everitt Aus, llame a su consultorio hoy mismo. Si presenta fiebre, vmitos o empeoramiento del dolor, regrese a Oceanographer.  Take antibiotics as prescribed and complete the full course. Follow up with Dr. Selma with Urology as soon as possible by calling his office today to schedule an appointment. Please return to the Emergency Department if you have fevers, vomiting, or worsening pain.

## 2023-11-29 NOTE — ED Provider Notes (Signed)
 Accepted handoff at shift change from Leita Chancy PA-C. Please see prior provider note for more detail.   Briefly: Patient is 48 y.o. presents today for abdominal pain, headache, and back pain times approximately 1 month intermittently.  Patient also reports possible intermittent fevers and dark urine with foul odor.  Patient denies any other urinary symptoms.  DDX: concern for UTI, pyelonephritis, kidney stone, septic stone,  Plan: Urology consult which will determine disposition.  Physical Exam Vitals and nursing note reviewed.  Constitutional:      General: She is not in acute distress.    Appearance: She is well-developed. She is not ill-appearing or diaphoretic.  HENT:     Head: Normocephalic and atraumatic.  Eyes:     Extraocular Movements: Extraocular movements intact.  Cardiovascular:     Rate and Rhythm: Normal rate.  Pulmonary:     Effort: Pulmonary effort is normal. No respiratory distress.  Abdominal:     General: There is no distension.  Skin:    General: Skin is warm and dry.     Capillary Refill: Capillary refill takes less than 2 seconds.  Neurological:     General: No focal deficit present.     Mental Status: She is alert.      Consulted urology, Dr. Selma who recommended Foley catheter, oral antibiotics and follow-up in clinic.  Foley catheter placed.  Considered for admission or further workup however patient's vital signs, physical exam, labs, and imaging are reassuring.  Foley catheter placed per urology recommendation.  Patient given outpatient course of Keflex  and advised to follow-up with urology for further evaluation workup.  Patient given return precautions.  I feel patient is safe for discharge at this time.   Francis Ileana SAILOR, PA-C 11/29/23 [id number]    Yolande Lamar BROCKS, MD 12/04/23 [phone]

## 2023-11-29 NOTE — ED Provider Notes (Signed)
 [place] EMERGENCY DEPARTMENT AT [facility] Provider Note   CSN: [id number] Arrival date & time: 11/28/23  1947     Patient presents with: Abdominal Pain and Headache   Lynn Vazquez is a 48 y.o. female.   48 year old female presents with abdominal pain, headache, and a pinching feeling in the back. Symptoms started with stomach and back pain for the past month, thought symptoms would resolve on their own. Headache started 2 days ago, pain located right temple/frontal area, pain is constant, gradual onset, feels light headed at time. Denies vomiting. Possible intermittent fevers, has not checked temperature. Reports urine is strong yellow with foul odor, no dysuria. Reports constipation, last bowel movement was yesterday. Reports surgical sterilization (possible tubal ligation).   A language interpreter was used ([place]).  Abdominal Pain Headache Associated symptoms: abdominal pain        Prior to Admission medications   Medication Sig Start Date End Date Taking? Authorizing Provider  acetaminophen  (TYLENOL ) 325 MG tablet Take 650 mg by mouth every 6 (six) hours as needed for mild pain, moderate pain or headache.    [provider]  acetaZOLAMIDE  (DIAMOX ) 125 MG tablet Take 2 tablets (250 mg total) by mouth 2 (two) times daily. 11/02/18 12/02/18  Mario Million, MD  calcium carbonate (OSCAL) 1500 (600 Ca) MG TABS tablet Take by mouth 2 (two) times daily with a meal.    [provider]  cephALEXin  (KEFLEX ) 500 MG capsule Take 1 capsule (500 mg total) by mouth 2 (two) times daily. 04/19/22   Parsons, Melissa A, NP  diclofenac (VOLTAREN) 75 MG EC tablet Take 75 mg by mouth 2 (two) times daily.    [provider]  glipiZIDE (GLUCOTROL) 10 MG tablet glipizide 10 mg tablet  Take 1 tablet twice a day by oral route.    [provider]  hydrOXYzine (ATARAX) 25 MG tablet hydroxyzine HCl 25 mg tablet  TAKE ONE TABLET BY MOUTH AT  BEDTIME    [provider]  insulin  NPH Human (NOVOLIN N) 100 UNIT/ML injection Novolin N NPH U-100 Insulin  isophane 100 unit/mL subcutaneous susp  Inject 42 units every day by subcutaneous route in the evening.    [provider]  lisinopril (ZESTRIL) 10 MG tablet lisinopril 10 mg tablet  Take 1 tablet twice a day by oral route.    [provider]  metFORMIN (GLUCOPHAGE) 1000 MG tablet Take 1,000 mg by mouth 2 (two) times daily with a meal.    [provider]  methocarbamol  (ROBAXIN ) 500 MG tablet Take 1 tablet (500 mg total) by mouth 2 (two) times daily. 08/27/20   Caccavale, Sophia, PA-C  naproxen  (NAPROSYN ) 500 MG tablet Take 1 tablet (500 mg total) by mouth 2 (two) times daily with a meal. 08/27/20   Caccavale, Sophia, PA-C  Omega-3 Fatty Acids (OMEGA 3 500 PO) Omega 3  One capsule by oral route twice daily    [provider]  predniSONE  (DELTASONE ) 10 MG tablet Take 4 tablets (40 mg total) by mouth daily with breakfast. 07/13/22   Schutt, Marsa HERO, PA-C  buPROPion  (WELLBUTRIN  SR) 150 MG 12 hr tablet Take 1 tablet (150 mg total) by mouth 2 (two) times daily. Patient not taking: Reported on 09/17/2014 07/01/13 11/02/18  Advani, Deepak, MD  famotidine  (PEPCID ) 20 MG tablet Take 1 tablet (20 mg total) by mouth 2 (two) times daily. Patient not taking: Reported on 05/12/2015 09/17/14 11/02/18  Devora Perkins, PA-C    Allergies:  Patient has no known allergies.    Review of Systems  Gastrointestinal:  Positive for abdominal pain.  Neurological:  Positive for headaches.   Negative except as per HPI Updated Vital Signs BP (!) 129/91 (BP Location: Right Arm)   Pulse 93   Temp 98.5 F (36.9 C) (Oral)   Resp 18   Ht 5' 5 (1.651 m)   Wt 129.3 kg   SpO2 100%   BMI 47.43 kg/m   Physical Exam Vitals and nursing note reviewed.  Constitutional:      General: She is not in acute distress.    Appearance: She is well-developed. She is not diaphoretic.  HENT:      Head: Normocephalic and atraumatic.  Cardiovascular:     Rate and Rhythm: Normal rate and regular rhythm.     Heart sounds: Normal heart sounds.  Pulmonary:     Effort: Pulmonary effort is normal.     Breath sounds: Normal breath sounds.  Abdominal:     Palpations: Abdomen is soft.     Tenderness: There is generalized abdominal tenderness. There is no right CVA tenderness or left CVA tenderness.  Skin:    General: Skin is warm and dry.  Neurological:     Mental Status: She is alert and oriented to person, place, and time.  Psychiatric:        Behavior: Behavior normal.     (all labs ordered are listed, but only abnormal results are displayed) Labs Reviewed  COMPREHENSIVE METABOLIC PANEL WITH GFR - Abnormal; Notable for the following components:      Result Value   Sodium 134 (*)    CO2 20 (*)    Glucose, Bld 349 (*)    Anion gap 16 (*)    All other components within normal limits  URINALYSIS, ROUTINE W REFLEX MICROSCOPIC - Abnormal; Notable for the following components:   APPearance CLOUDY (*)    Glucose, UA >=500 (*)    Protein, ur 100 (*)    Nitrite POSITIVE (*)    Leukocytes,Ua LARGE (*)    Bacteria, UA RARE (*)    All other components within normal limits  LIPASE, BLOOD  CBC  HCG, SERUM, QUALITATIVE    EKG: None  Radiology: CT Renal Stone Study Result Date: 11/29/2023 CLINICAL DATA:  Abdominal and left flank pain. EXAM: CT ABDOMEN AND PELVIS WITHOUT CONTRAST TECHNIQUE: Multidetector CT imaging of the abdomen and pelvis was performed following the standard protocol without IV contrast. RADIATION DOSE REDUCTION: This exam was performed according to the departmental dose-optimization program which includes automated exposure control, adjustment of the mA and/or kV according to patient size and/or use of iterative reconstruction technique. COMPARISON:  10/29/2017 FINDINGS: Lower chest: 6 mm left lower lobe pulmonary nodule identified on 34/5. This is stable in the  interval consistent with benign etiology. No followup imaging is recommended. Right base subsegmental atelectasis noted. Hepatobiliary: No suspicious focal abnormality in the liver on this study without intravenous contrast. There is no evidence for gallstones, gallbladder wall thickening, or pericholecystic fluid. No intrahepatic or extrahepatic biliary dilation. Pancreas: No focal mass lesion. No dilatation of the main duct. No intraparenchymal cyst. No peripancreatic edema. Spleen: No splenomegaly. No suspicious focal mass lesion. Adrenals/Urinary Tract: Left adrenal gland unremarkable. 11 mm low-density right adrenal nodule is compatible with benign adenoma. No followup imaging is recommended. Kidneys unremarkable. No evidence for hydroureter. Relatively large volume of gas is identified in the bladder lumen Stomach/Bowel: Stomach is unremarkable. No gastric wall thickening. No  evidence of outlet obstruction. Duodenum is normally positioned as is the ligament of Treitz. No small bowel wall thickening. No small bowel dilatation. The terminal ileum is normal. The appendix is normal. No gross colonic mass. No colonic wall thickening. Moderate to large stool volume evident. Vascular/Lymphatic: No abdominal aortic aneurysm. No abdominal aortic atherosclerotic calcification. There is no gastrohepatic or hepatoduodenal ligament lymphadenopathy. No retroperitoneal or mesenteric lymphadenopathy. No pelvic sidewall lymphadenopathy. Reproductive: The uterus is unremarkable.  There is no adnexal mass. Other: No intraperitoneal free fluid. Musculoskeletal: Degenerative changes are noted in both hips, left greater than right. No worrisome lytic or sclerotic osseous abnormality. IMPRESSION: 1. No urinary stone disease. No secondary changes in either kidney or ureter. 2. Relatively large volume of gas in the bladder lumen. This could be related to recent instrumentation or infection. No bladder wall thickening or perivesical  edema/inflammation. 3. Moderate to large stool volume. Imaging features could be compatible with clinical constipation. Electronically Signed   By: Camellia Candle M.D.   On: 11/29/2023 05:39     Procedures   Medications Ordered in the ED  sodium chloride  0.9 % bolus 1,000 mL (1,000 mLs Intravenous New Bag/Given 11/29/23 0614)  cefTRIAXone  (ROCEPHIN ) 1 g in sodium chloride  0.9 % 100 mL IVPB (1 g Intravenous New Bag/Given 11/29/23 0610)  morphine  (PF) 4 MG/ML injection 4 mg (4 mg Intravenous Given 11/29/23 0611)  ondansetron  (ZOFRAN ) injection 4 mg (4 mg Intravenous Given 11/29/23 9388)                                     Medical Decision Making Amount and/or Complexity of Data Reviewed Labs: ordered. Radiology: ordered.  Risk Prescription drug management.   This patient presents to the ED for concern of abdominal pain, this involves an extensive number of treatment options, and is a complaint that carries with it a high risk of complications and morbidity.  The differential diagnosis includes includes but not limited to gastritis, colitis, diverticulitis, kidney stone   Co morbidities / Chronic conditions that complicate the patient evaluation  Diabetes, hyperlipidemia   Additional history obtained:  Additional history obtained from EMR External records from outside source obtained and reviewed including prior labs on file   Lab Tests:  I Ordered, and personally interpreted labs.  The pertinent results include: CBC without significant findings.  CMP with elevated glucose, no diabetes.  Lipase normal.  hCG negative.  Urinalysis consistent with UTI with protein, nitrites, leukocytes with greater than 50 white cells and rare bacteria.   Imaging Studies ordered:  I ordered imaging studies including CT stone study I independently visualized and interpreted imaging which showed significant amount of gas in bladder, patient has not been catheterized. I agree with the radiologist  interpretation   Problem List / ED Course / Critical interventions / Medication management  48 year old female presents with complaint of abdominal pain, headache and a pinching feeling in her back.  Also notes her urine is a dark yellow in color with foul odor, no dysuria.  Labs consistent with urinary tract infection.  CT concerning for significant amount of gas in the bladder.  Care signed out at change of shift pending consult with urology. I ordered medication including Rocephin , morphine , Zofran  I have reviewed the patients home medicines and have made adjustments as needed   Consultations Obtained:  I requested consultation with the ER attending, Dr. Nettie,  and discussed lab and  imaging findings as well as pertinent plan - they recommend: Consult urology   Social Determinants of Health:  Lives with family   Test / Admission - Considered:  Disposition pending at time of signout      Final diagnoses:  Urinary tract infection in female  Generalized abdominal pain    ED Discharge Orders     None          Beverley Leita DELENA DEVONNA 11/29/23 [id number]    Palumbo, April, MD 11/29/23 [phone]

## 2023-12-01 LAB — URINE CULTURE: Culture: >=100000 — AB

## 2023-12-02 ENCOUNTER — Telehealth (HOSPITAL_BASED_OUTPATIENT_CLINIC_OR_DEPARTMENT_OTHER): Payer: Self-pay | Admitting: *Deleted

## 2023-12-02 NOTE — Telephone Encounter (Signed)
 Post ED Visit - Positive Culture Follow-up  Culture report reviewed by antimicrobial stewardship pharmacist: [facility] [x]  Rankin Sams, Pharm.D. []  Venetia Gully, Pharm.D., BCPS AQ-ID []  Garrel Crews, Pharm.D., BCPS []  Almarie Lunger, Pharm.D., BCPS []  [place], [place].D., BCPS, AAHIVP []  Rosaline Bihari, Pharm.D., BCPS, AAHIVP []  Vernell Meier, PharmD, BCPS []  Latanya Hint, PharmD, BCPS []  Donald Medley, PharmD, BCPS []  Rocky Bold, PharmD []  Dorothyann Alert, PharmD, BCPS []  Morene Babe, PharmD  [facility] []  Rosaline Edison, PharmD []  Romona Bliss, PharmD []  Dolphus Roller, PharmD []  Veva Seip, Rph []  Vernell Daunt) Leonce, PharmD []  Eva Allis, PharmD []  Rosaline Millet, PharmD []  Iantha Batch, PharmD []  Arvin Gauss, PharmD []  Wanda Hasting, PharmD []  Ronal Rav, PharmD []  Rocky Slade, PharmD []  Bard Jeans, PharmD   Positive urine culture Treated with Cephalexin , organism sensitive to the same and no further patient follow-up is required at this time.  Lynn Vazquez 12/02/2023, 9:04 AM

## 2023-12-16 IMAGING — MR MR CERVICAL SPINE W/O CM
4 of 5 series · 19 of 48 positions shown · non-contrast
Comparison: None Available.

CLINICAL DATA: Myelopathy, acute, cervical spine

EXAM:
MRI CERVICAL SPINE WITHOUT CONTRAST
TECHNIQUE: Multiplanar, multisequence MR imaging of the cervical spine was
performed. No intravenous contrast was administered.

[Series 3: T2 · sagittal · 3.0mm · 0.43mm/px · 5 of 16 slices shown (1 of 2)]
[im 1/16]
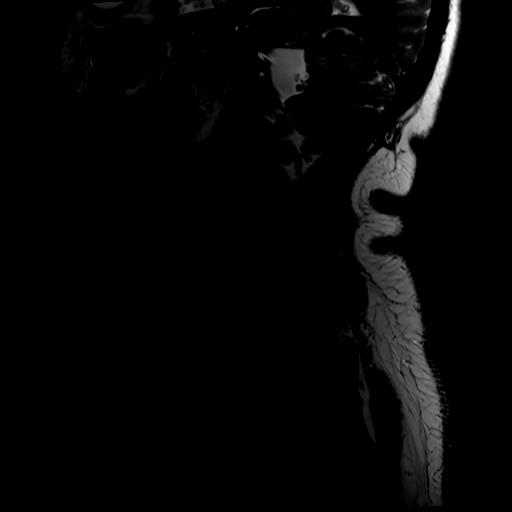
[im 4/16]
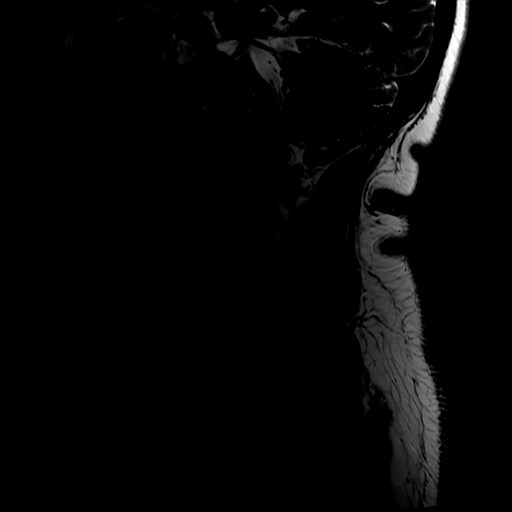
[im 8/16]
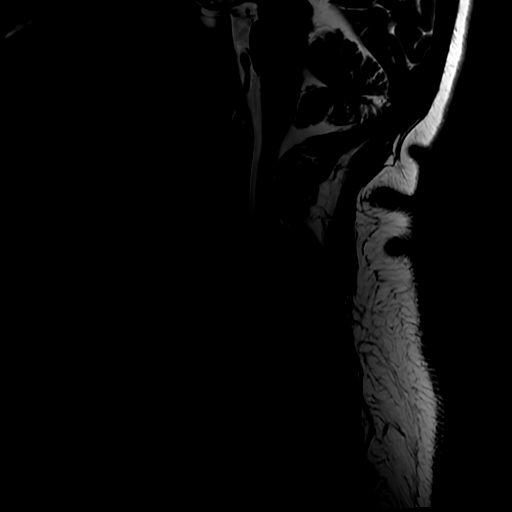
[im 12/16]
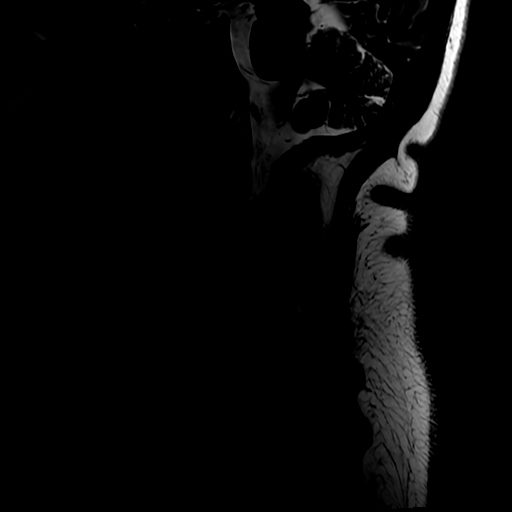
[im 16/16]
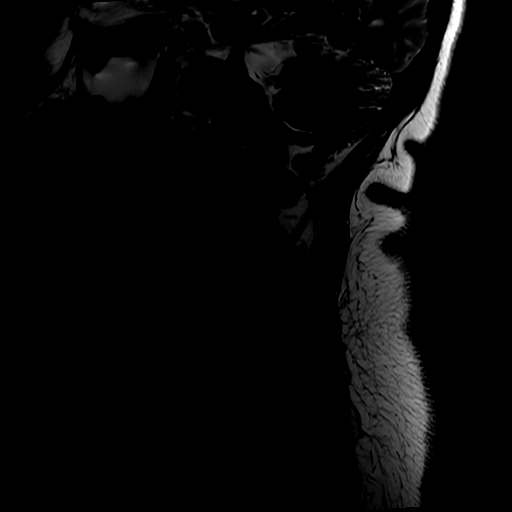

[Series 4: FLAIR · sagittal · 3.0mm · 0.43mm/px · 3 of 16 slices shown]
[im 1/16]
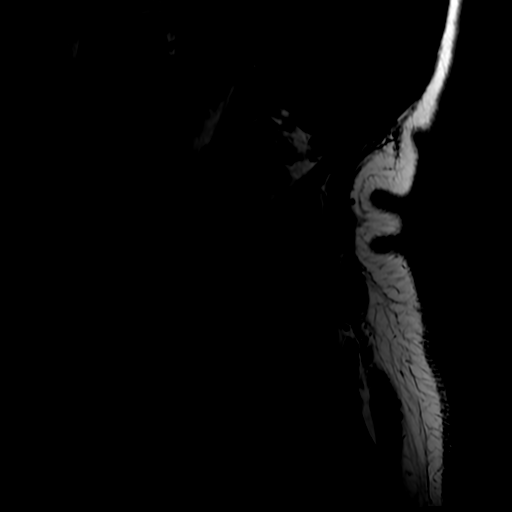
[im 8/16]
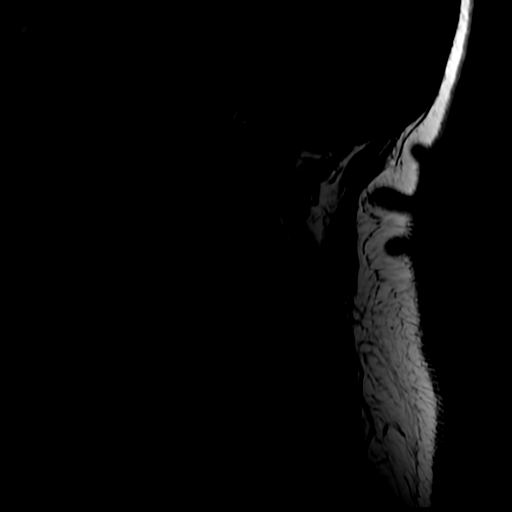
[im 16/16]
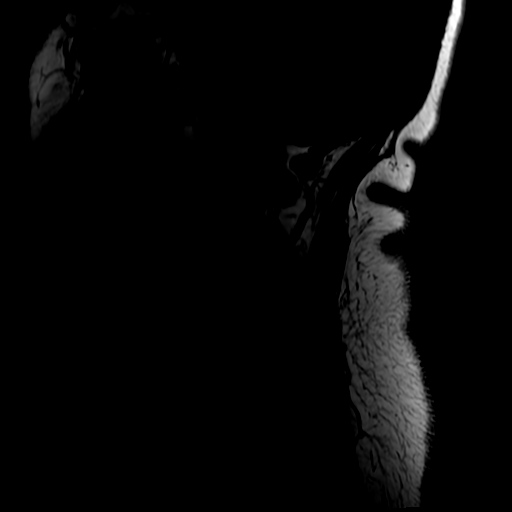

[Series 6: STIR · sagittal · 3.0mm · 0.43mm/px · 3 of 16 slices shown]
[im 1/16]
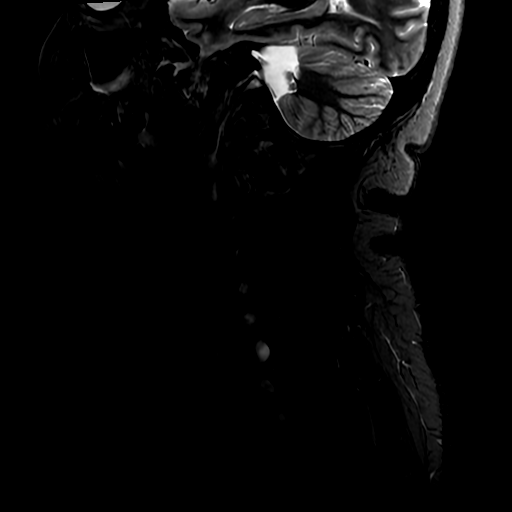
[im 8/16]
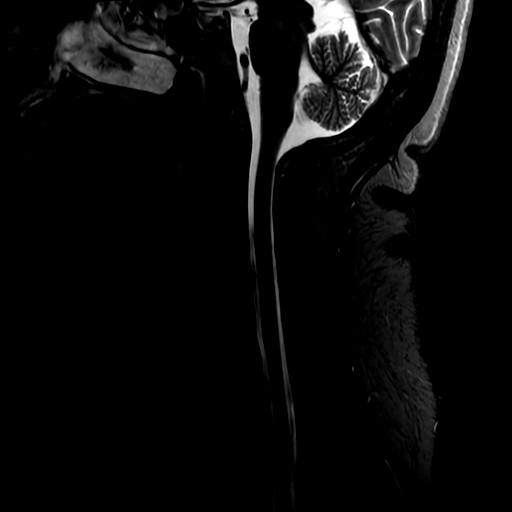
[im 16/16]
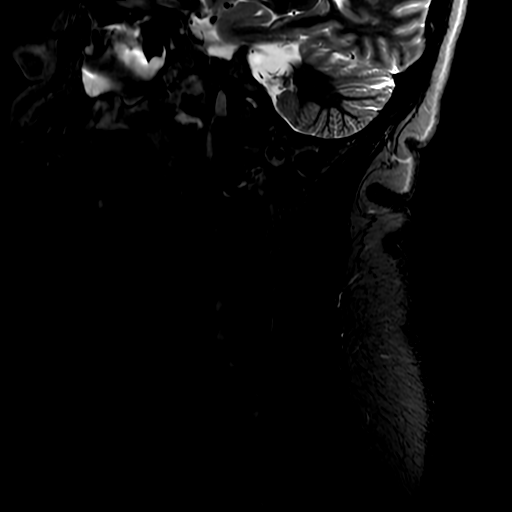

[Series 8: T2 · axial · 3.0mm · 0.35mm/px · z∈[-190,-104]mm · 8 of 27 slices shown (2 of 2)]
[im 1/27]
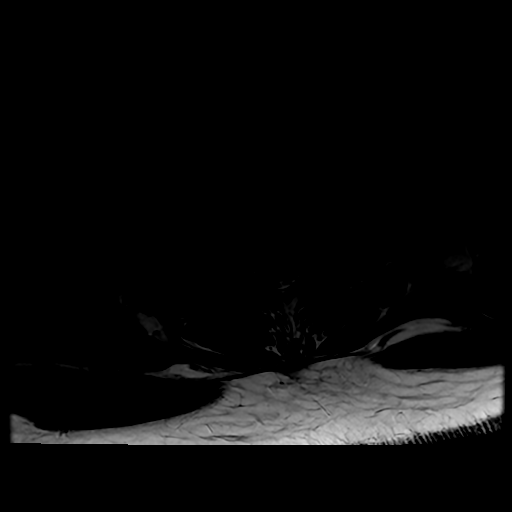
[im 4/27]
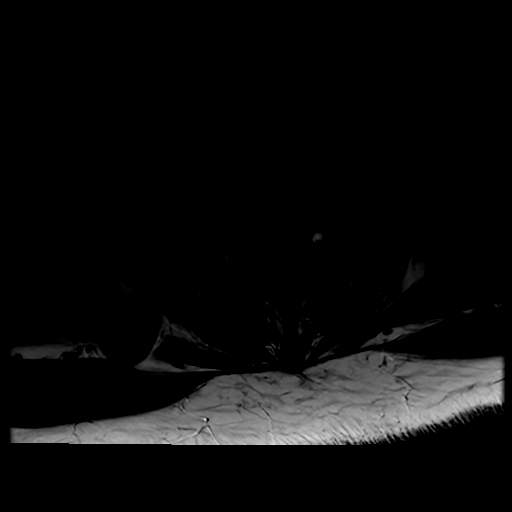
[im 8/27]
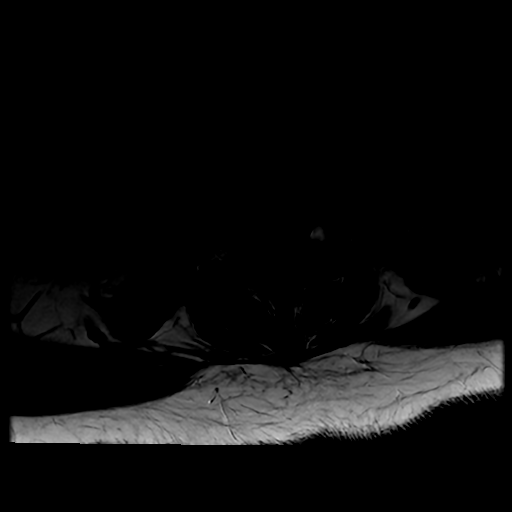
[im 12/27]
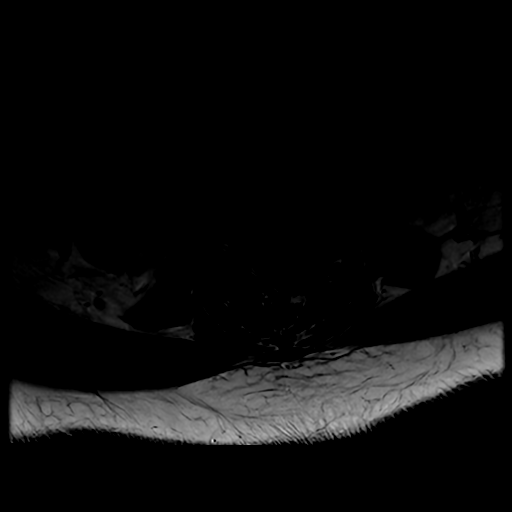
[im 15/27]
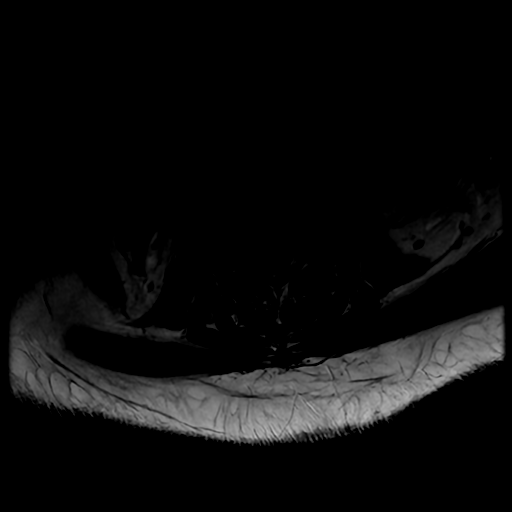
[im 19/27]
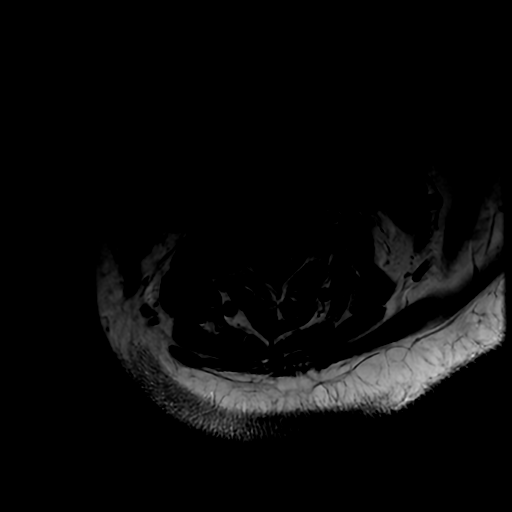
[im 23/27]
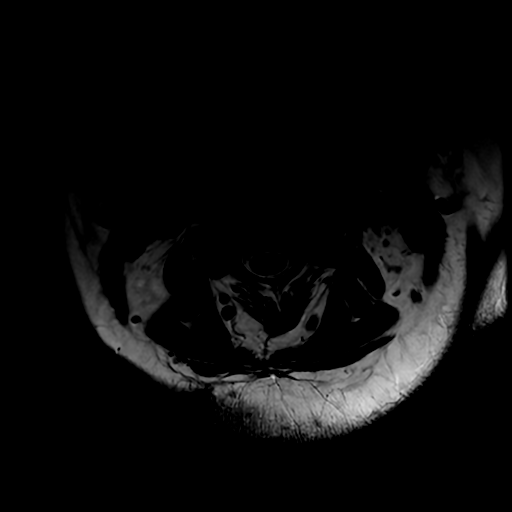
[im 27/27]
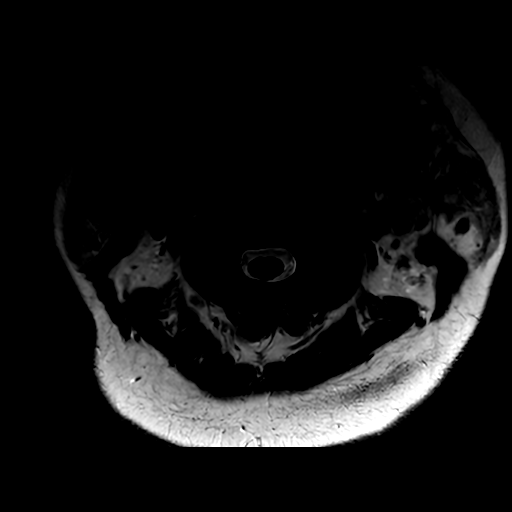

[19 of 48 positions shown; findings below may reference images not displayed]

FINDINGS: Alignment: No significant listhesis.

Vertebrae: Vertebral body heights are maintained. Minor degenerative
endplate irregularity primarily at C3-C4. No marrow edema. No
suspicious osseous lesion.

Cord: No abnormal signal

Posterior Fossa, vertebral arteries, paraspinal tissues: Partially
empty sella as noted on brain MRI. Otherwise unremarkable.

Disc levels:

C2-C3:  Facet hypertrophy.  No canal or foraminal stenosis.

C3-C4: Disc bulge with endplate osteophytes. Uncovertebral
hypertrophy. No canal stenosis. Moderate foraminal stenosis.

C4-C5: Disc bulge with endplate osteophytes. Right greater than left
uncovertebral hypertrophy. No canal stenosis. Moderate to marked
foraminal stenosis.

C5-C6: Disc bulge with endplate osteophytes. Uncovertebral
hypertrophy. No canal stenosis. Mild right and moderate left
foraminal stenosis.

C6-C7:  Uncovertebral hypertrophy.  No canal or foraminal stenosis.

C7-T1: Disc bulge with endplate osteophytes. Facet hypertrophy. No
canal or right foraminal stenosis. Mild left foraminal stenosis.
IMPRESSION: Multilevel degenerative changes as detailed above. No significant
canal stenosis. Multilevel foraminal narrowing.

No abnormal cord signal.

## 2023-12-16 IMAGING — MR MR HEAD W/O CM
6 of 11 series · 24 of 48 positions shown · non-contrast
Comparison: Head CT 08/11/2021. MRI brain and MR venogram head
08/13/2016.

CLINICAL DATA: Provided history: Neuro deficit, acute, stroke
suspected. Additional history provided: Dizziness, syncopal episode,
fall, left arm numbness.

EXAM:
MRI HEAD WITHOUT CONTRAST
TECHNIQUE: Multiplanar, multiecho pulse sequences of the brain and surrounding
structures were obtained without intravenous contrast.

[Series 2: DWI · axial · 3.0mm · 0.94mm/px · z∈[-53,+94]mm · 7 of 99 slices shown (1 of 2)]
[im 1/99]
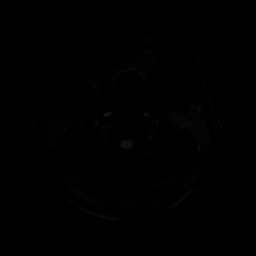
[im 17/99]
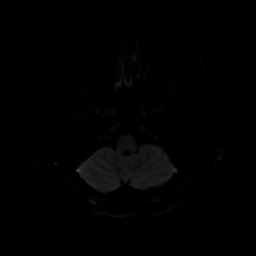
[im 33/99]
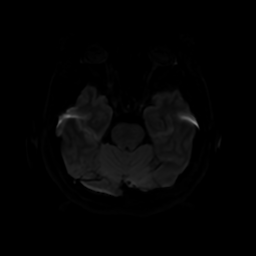
[im 50/99]
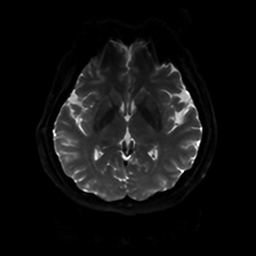
[im 66/99]
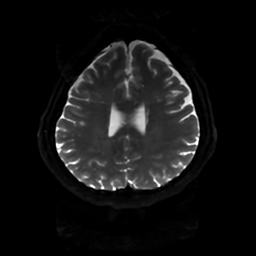
[im 82/99]
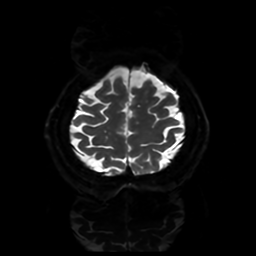
[im 99/99]
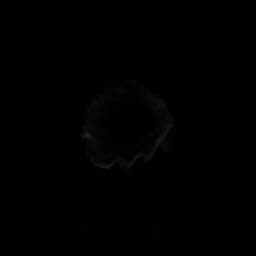

[Series 3: DWI · coronal · 4.0mm · 0.94mm/px · 5 of 74 slices shown (2 of 2)]
[im 1/74]
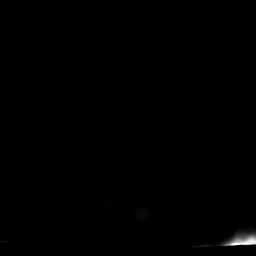
[im 19/74]
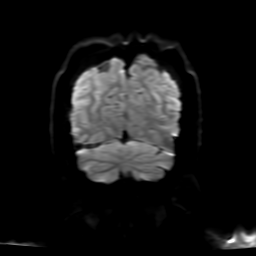
[im 37/74]
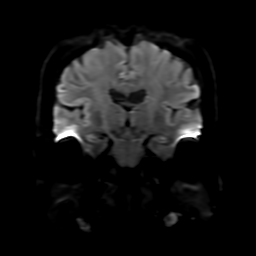
[im 55/74]
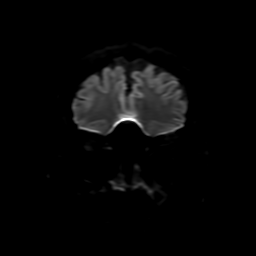
[im 74/74]
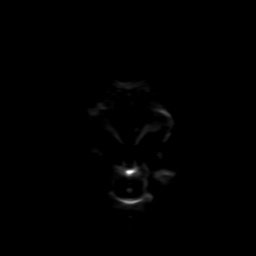

[Series 4: FLAIR · sagittal · 5.0mm · 0.23mm/px · 2 of 23 slices shown (1 of 2)]
[im 1/23]
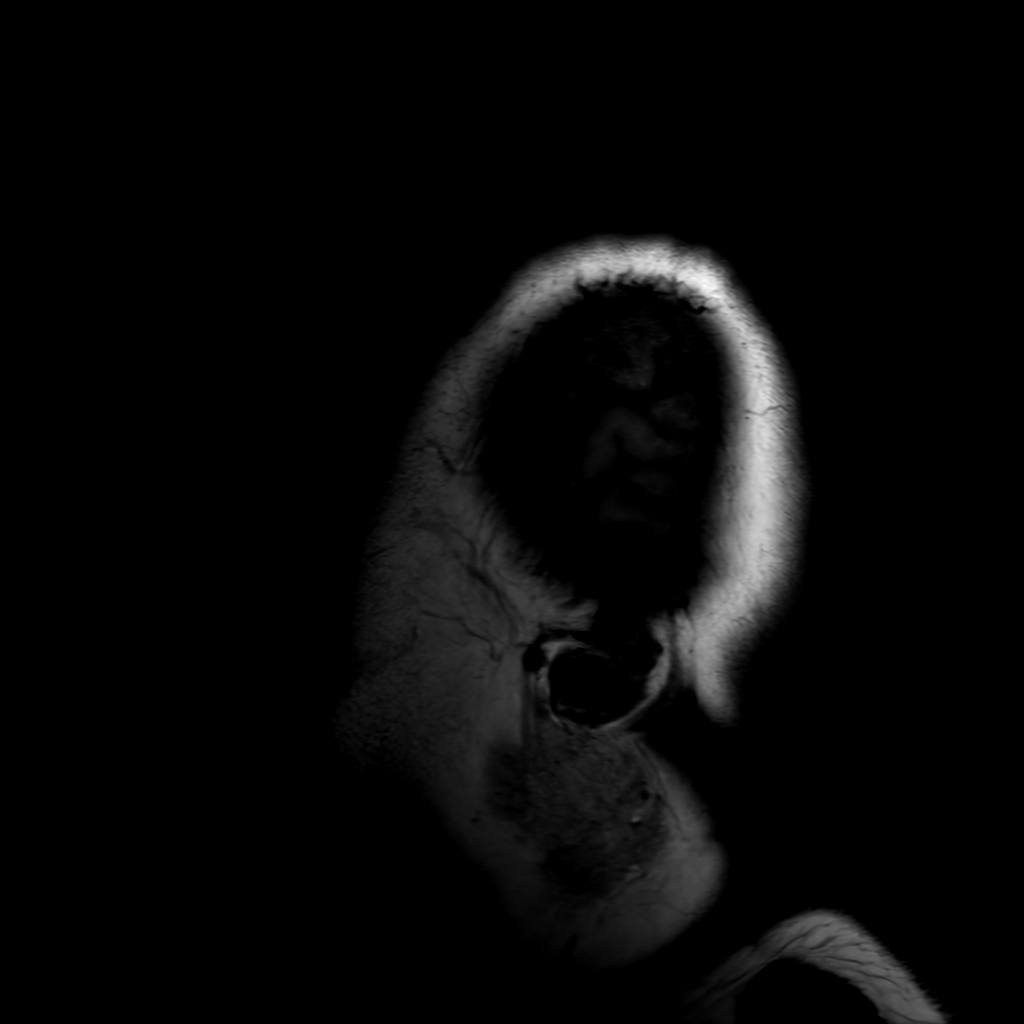
[im 23/23]
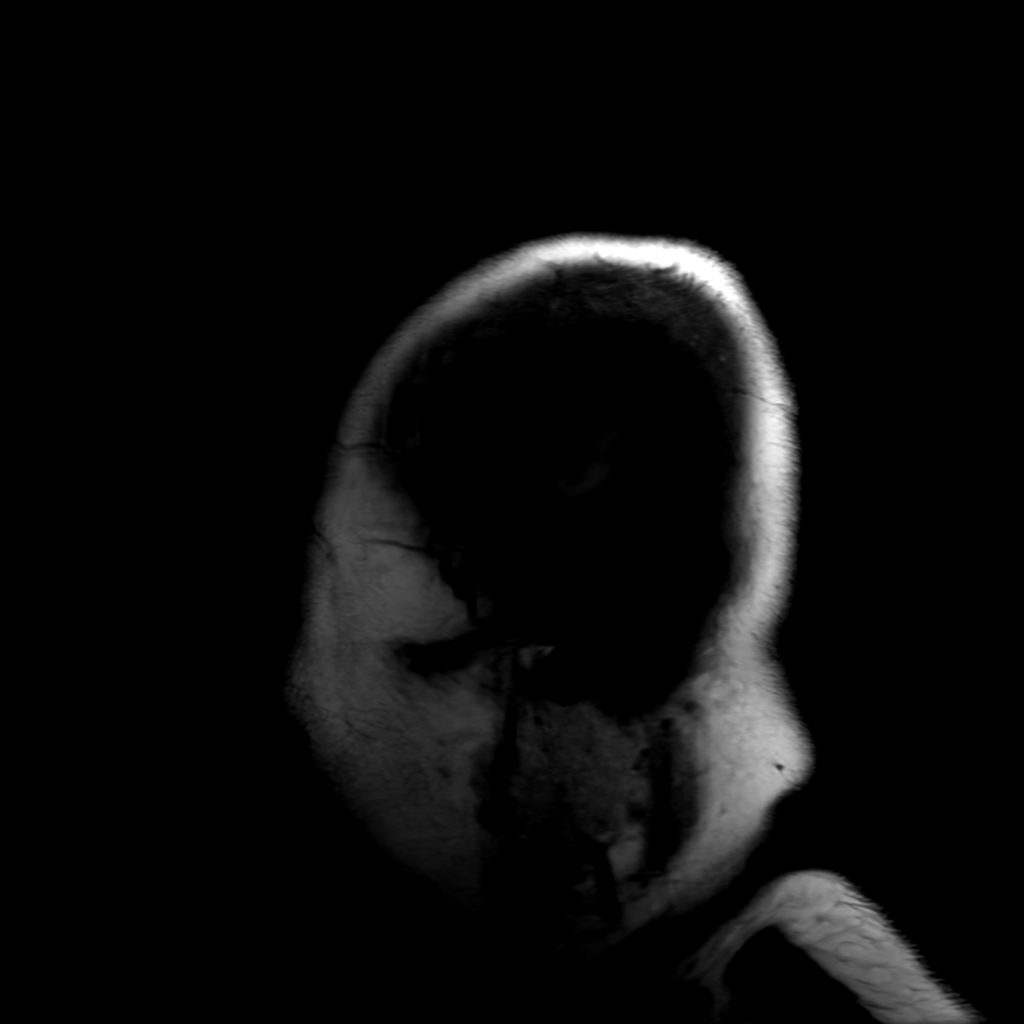

[Series 6: FLAIR · axial · 4.0mm · 0.45mm/px · z∈[-53,+97]mm · 3 of 35 slices shown (2 of 2)]
[im 1/35]
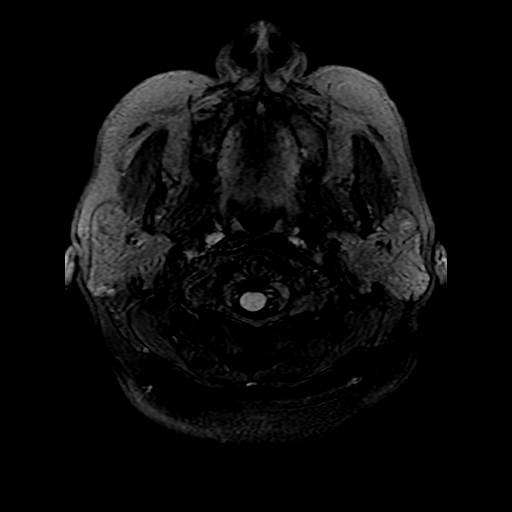
[im 18/35]
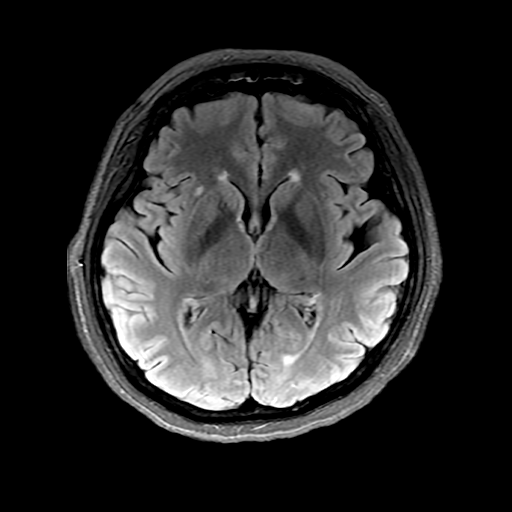
[im 35/35]
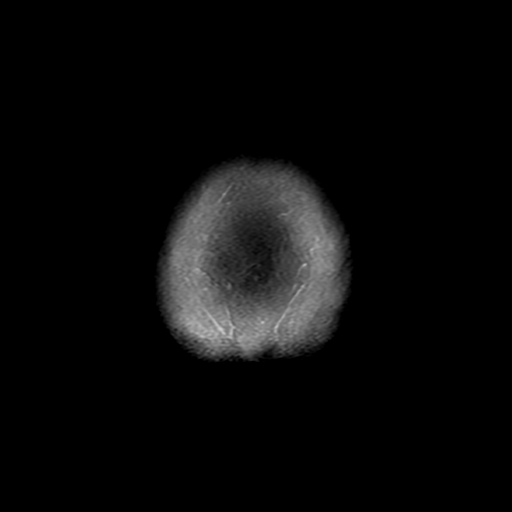

[Series 250: ADC · axial · 3.0mm · 0.94mm/px · z∈[-53,+94]mm · 4 of 50 slices shown (1 of 2)]
[im 1/50]
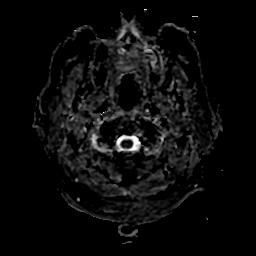
[im 17/50]
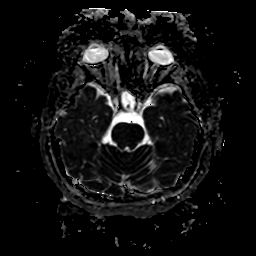
[im 33/50]
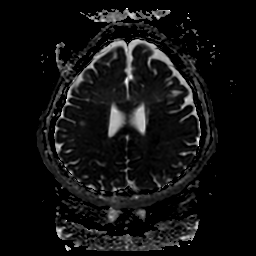
[im 50/50]
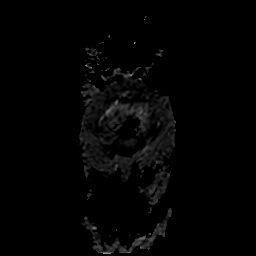

[Series 350: ADC · coronal · 4.0mm · 0.94mm/px · 3 of 37 slices shown (2 of 2)]
[im 1/37]
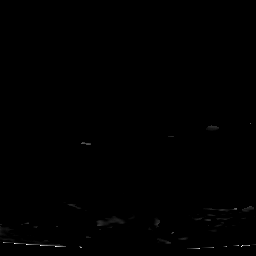
[im 19/37]
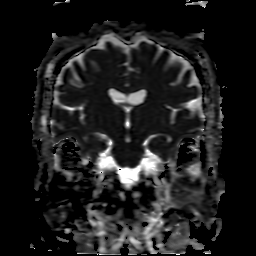
[im 37/37]
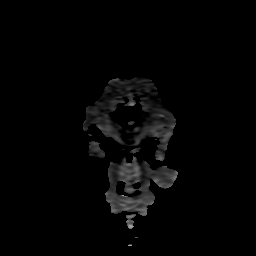

[24 of 48 positions shown; findings below may reference images not displayed]

FINDINGS: Brain:

No age-advanced or lobar predominant parenchymal atrophy.

Multifocal T2 FLAIR hyperintense signal abnormality within the
cerebral white matter, overall mild but advanced for age.

"Empty" and somewhat expanded appearance of the sella turcica.

There is no acute infarct.

No evidence of an intracranial mass.

No chronic intracranial blood products.

No extra-axial fluid collection.

No midline shift.

Vascular: Maintained flow voids within the proximal large arterial
vessels.

Skull and upper cervical spine: No focal suspicious marrow lesion.

Sinuses/Orbits: No mass or acute finding within the imaged orbits.
Severe mucosal thickening within the left maxillary sinus. Mild
mucosal thickening within the right maxillary sinus. Minimal mucosal
thickening within the bilateral ethmoid and frontal sinuses.
IMPRESSION: No evidence of acute intracranial abnormality.

Multifocal T2 FLAIR hyperintense signal abnormality within the
cerebral white matter, overall mild but advanced for age. These
signal changes are nonspecific and differential considerations
include chronic small vessel ischemic disease, sequela of chronic
migraine headaches, sequela of a prior infectious/inflammatory
process or less likely (given the distribution) sequela of a
demyelinating process (such as multiple sclerosis), among others.
These signal changes have progressed from the prior MRI of
08/13/2016.

"Empty" and somewhat expanded appearance of the sella turcica. While
this finding can reflect incidental anatomic variation, it can also
be associated with idiopathic intracranial hypertension (pseudotumor
cerebri).

Paranasal sinus disease, as described.
